# Patient Record
Sex: Female | Born: 1950 | Race: White | Hispanic: No | Marital: Married | Smoking: Never smoker
Health system: Southern US, Community
[De-identification: ages and names within clinical notes are randomized; demographics above are authoritative.]

## PROBLEM LIST (undated history)

## (undated) DIAGNOSIS — E785 Hyperlipidemia, unspecified: Secondary | ICD-10-CM

## (undated) DIAGNOSIS — F32A Depression, unspecified: Secondary | ICD-10-CM

## (undated) DIAGNOSIS — K760 Fatty (change of) liver, not elsewhere classified: Secondary | ICD-10-CM

## (undated) DIAGNOSIS — G4730 Sleep apnea, unspecified: Secondary | ICD-10-CM

## (undated) DIAGNOSIS — T8859XA Other complications of anesthesia, initial encounter: Secondary | ICD-10-CM

## (undated) DIAGNOSIS — G629 Polyneuropathy, unspecified: Secondary | ICD-10-CM

## (undated) DIAGNOSIS — R7303 Prediabetes: Secondary | ICD-10-CM

## (undated) DIAGNOSIS — I10 Essential (primary) hypertension: Secondary | ICD-10-CM

## (undated) DIAGNOSIS — G8929 Other chronic pain: Secondary | ICD-10-CM

## (undated) DIAGNOSIS — Z9889 Other specified postprocedural states: Secondary | ICD-10-CM

## (undated) DIAGNOSIS — R519 Headache, unspecified: Secondary | ICD-10-CM

## (undated) DIAGNOSIS — Z8709 Personal history of other diseases of the respiratory system: Secondary | ICD-10-CM

## (undated) DIAGNOSIS — J309 Allergic rhinitis, unspecified: Secondary | ICD-10-CM

## (undated) HISTORY — PX: BUNIONECTOMY: SHX129

## (undated) HISTORY — DX: Allergic rhinitis, unspecified: J30.9

## (undated) HISTORY — DX: Hyperlipidemia, unspecified: E78.5

## (undated) HISTORY — DX: Essential (primary) hypertension: I10

## (undated) HISTORY — PX: CATARACT EXTRACTION W/ INTRAOCULAR LENS IMPLANT: SHX1309

## (undated) HISTORY — PX: EYE SURGERY: SHX253

## (undated) HISTORY — DX: Sleep apnea, unspecified: G47.30

## (undated) HISTORY — DX: Headache, unspecified: R51.9

## (undated) HISTORY — DX: Other chronic pain: G89.29

---

## 2003-02-20 HISTORY — PX: LAPAROSCOPIC HYSTERECTOMY: SHX1926

## 2004-02-20 HISTORY — PX: CHOLECYSTECTOMY: SHX55

## 2007-05-26 ENCOUNTER — Encounter: Admission: RE | Admit: 2007-05-26 | Discharge: 2007-05-26 | Payer: Self-pay | Admitting: Family Medicine

## 2007-06-05 ENCOUNTER — Encounter: Admission: RE | Admit: 2007-06-05 | Discharge: 2007-06-05 | Payer: Self-pay | Admitting: Family Medicine

## 2008-04-05 ENCOUNTER — Encounter: Admission: RE | Admit: 2008-04-05 | Discharge: 2008-04-05 | Payer: Self-pay | Admitting: Family Medicine

## 2008-05-29 ENCOUNTER — Encounter: Admission: RE | Admit: 2008-05-29 | Discharge: 2008-05-29 | Payer: Self-pay | Admitting: Otolaryngology

## 2008-11-05 ENCOUNTER — Encounter: Admission: RE | Admit: 2008-11-05 | Discharge: 2008-11-05 | Payer: Self-pay | Admitting: Family Medicine

## 2009-06-16 ENCOUNTER — Emergency Department (HOSPITAL_COMMUNITY): Admission: EM | Admit: 2009-06-16 | Discharge: 2009-06-16 | Payer: Self-pay | Admitting: Emergency Medicine

## 2010-07-27 ENCOUNTER — Other Ambulatory Visit: Payer: Self-pay | Admitting: Family Medicine

## 2010-07-27 DIAGNOSIS — Z1231 Encounter for screening mammogram for malignant neoplasm of breast: Secondary | ICD-10-CM

## 2010-07-27 DIAGNOSIS — Z780 Asymptomatic menopausal state: Secondary | ICD-10-CM

## 2010-08-03 ENCOUNTER — Ambulatory Visit
Admission: RE | Admit: 2010-08-03 | Discharge: 2010-08-03 | Disposition: A | Discharge status: Home / Self Care | Payer: BC Managed Care – PPO | Source: Ambulatory Visit | Attending: Family Medicine | Admitting: Family Medicine

## 2010-08-03 DIAGNOSIS — Z780 Asymptomatic menopausal state: Secondary | ICD-10-CM

## 2010-08-03 DIAGNOSIS — Z1231 Encounter for screening mammogram for malignant neoplasm of breast: Secondary | ICD-10-CM

## 2011-07-31 ENCOUNTER — Other Ambulatory Visit: Payer: Self-pay | Admitting: Family Medicine

## 2011-07-31 DIAGNOSIS — Z1231 Encounter for screening mammogram for malignant neoplasm of breast: Secondary | ICD-10-CM

## 2011-08-03 ENCOUNTER — Ambulatory Visit
Admission: RE | Admit: 2011-08-03 | Discharge: 2011-08-03 | Disposition: A | Discharge status: Home / Self Care | Payer: BC Managed Care – PPO | Source: Ambulatory Visit | Attending: Family Medicine | Admitting: Family Medicine

## 2011-08-03 DIAGNOSIS — Z1231 Encounter for screening mammogram for malignant neoplasm of breast: Secondary | ICD-10-CM

## 2011-08-08 ENCOUNTER — Other Ambulatory Visit: Payer: Self-pay | Admitting: Family Medicine

## 2011-08-08 DIAGNOSIS — R928 Other abnormal and inconclusive findings on diagnostic imaging of breast: Secondary | ICD-10-CM

## 2011-08-09 ENCOUNTER — Other Ambulatory Visit: Payer: Self-pay | Admitting: Family Medicine

## 2011-08-16 ENCOUNTER — Ambulatory Visit
Admission: RE | Admit: 2011-08-16 | Discharge: 2011-08-16 | Disposition: A | Discharge status: Home / Self Care | Payer: BC Managed Care – PPO | Source: Ambulatory Visit | Attending: Family Medicine | Admitting: Family Medicine

## 2011-08-16 DIAGNOSIS — R928 Other abnormal and inconclusive findings on diagnostic imaging of breast: Secondary | ICD-10-CM

## 2011-12-10 ENCOUNTER — Ambulatory Visit (INDEPENDENT_AMBULATORY_CARE_PROVIDER_SITE_OTHER): Payer: BC Managed Care – PPO | Admitting: Licensed Clinical Social Worker

## 2011-12-10 DIAGNOSIS — IMO0002 Reserved for concepts with insufficient information to code with codable children: Secondary | ICD-10-CM

## 2012-01-21 ENCOUNTER — Ambulatory Visit (INDEPENDENT_AMBULATORY_CARE_PROVIDER_SITE_OTHER): Payer: BC Managed Care – PPO | Admitting: Licensed Clinical Social Worker

## 2012-01-21 DIAGNOSIS — IMO0002 Reserved for concepts with insufficient information to code with codable children: Secondary | ICD-10-CM

## 2012-02-18 ENCOUNTER — Ambulatory Visit (INDEPENDENT_AMBULATORY_CARE_PROVIDER_SITE_OTHER): Payer: Self-pay | Admitting: Licensed Clinical Social Worker

## 2012-02-18 DIAGNOSIS — IMO0002 Reserved for concepts with insufficient information to code with codable children: Secondary | ICD-10-CM

## 2012-03-17 ENCOUNTER — Ambulatory Visit (INDEPENDENT_AMBULATORY_CARE_PROVIDER_SITE_OTHER): Payer: BC Managed Care – PPO | Admitting: Licensed Clinical Social Worker

## 2012-03-17 DIAGNOSIS — IMO0002 Reserved for concepts with insufficient information to code with codable children: Secondary | ICD-10-CM

## 2012-04-14 ENCOUNTER — Ambulatory Visit (INDEPENDENT_AMBULATORY_CARE_PROVIDER_SITE_OTHER): Payer: BC Managed Care – PPO | Admitting: Licensed Clinical Social Worker

## 2012-04-14 DIAGNOSIS — IMO0002 Reserved for concepts with insufficient information to code with codable children: Secondary | ICD-10-CM

## 2012-05-12 ENCOUNTER — Ambulatory Visit (INDEPENDENT_AMBULATORY_CARE_PROVIDER_SITE_OTHER): Payer: BC Managed Care – PPO | Admitting: Licensed Clinical Social Worker

## 2012-05-12 DIAGNOSIS — IMO0002 Reserved for concepts with insufficient information to code with codable children: Secondary | ICD-10-CM

## 2012-06-11 ENCOUNTER — Ambulatory Visit (INDEPENDENT_AMBULATORY_CARE_PROVIDER_SITE_OTHER): Payer: BC Managed Care – PPO | Admitting: Licensed Clinical Social Worker

## 2012-06-11 DIAGNOSIS — IMO0002 Reserved for concepts with insufficient information to code with codable children: Secondary | ICD-10-CM

## 2012-07-09 ENCOUNTER — Ambulatory Visit (INDEPENDENT_AMBULATORY_CARE_PROVIDER_SITE_OTHER): Payer: BC Managed Care – PPO | Admitting: Licensed Clinical Social Worker

## 2012-07-09 DIAGNOSIS — IMO0002 Reserved for concepts with insufficient information to code with codable children: Secondary | ICD-10-CM

## 2012-08-04 ENCOUNTER — Ambulatory Visit (INDEPENDENT_AMBULATORY_CARE_PROVIDER_SITE_OTHER): Payer: BC Managed Care – PPO | Admitting: Licensed Clinical Social Worker

## 2012-08-04 DIAGNOSIS — IMO0002 Reserved for concepts with insufficient information to code with codable children: Secondary | ICD-10-CM

## 2012-09-01 ENCOUNTER — Ambulatory Visit (INDEPENDENT_AMBULATORY_CARE_PROVIDER_SITE_OTHER): Payer: BC Managed Care – PPO | Admitting: Licensed Clinical Social Worker

## 2012-09-01 DIAGNOSIS — IMO0002 Reserved for concepts with insufficient information to code with codable children: Secondary | ICD-10-CM

## 2012-09-29 ENCOUNTER — Ambulatory Visit (INDEPENDENT_AMBULATORY_CARE_PROVIDER_SITE_OTHER): Payer: BC Managed Care – PPO | Admitting: Licensed Clinical Social Worker

## 2012-09-29 DIAGNOSIS — IMO0002 Reserved for concepts with insufficient information to code with codable children: Secondary | ICD-10-CM

## 2012-10-14 ENCOUNTER — Other Ambulatory Visit: Payer: Self-pay | Admitting: Family Medicine

## 2012-10-14 DIAGNOSIS — Z1231 Encounter for screening mammogram for malignant neoplasm of breast: Secondary | ICD-10-CM

## 2012-10-27 ENCOUNTER — Ambulatory Visit (INDEPENDENT_AMBULATORY_CARE_PROVIDER_SITE_OTHER): Payer: BC Managed Care – PPO | Admitting: Licensed Clinical Social Worker

## 2012-10-27 DIAGNOSIS — IMO0002 Reserved for concepts with insufficient information to code with codable children: Secondary | ICD-10-CM

## 2012-11-04 ENCOUNTER — Ambulatory Visit
Admission: RE | Admit: 2012-11-04 | Discharge: 2012-11-04 | Disposition: A | Discharge status: Home / Self Care | Payer: BC Managed Care – PPO | Source: Ambulatory Visit | Attending: Family Medicine | Admitting: Family Medicine

## 2012-11-04 DIAGNOSIS — Z1231 Encounter for screening mammogram for malignant neoplasm of breast: Secondary | ICD-10-CM

## 2012-11-26 ENCOUNTER — Ambulatory Visit (INDEPENDENT_AMBULATORY_CARE_PROVIDER_SITE_OTHER): Payer: BC Managed Care – PPO | Admitting: Licensed Clinical Social Worker

## 2012-11-26 DIAGNOSIS — IMO0002 Reserved for concepts with insufficient information to code with codable children: Secondary | ICD-10-CM

## 2012-12-31 ENCOUNTER — Ambulatory Visit (INDEPENDENT_AMBULATORY_CARE_PROVIDER_SITE_OTHER): Payer: BC Managed Care – PPO | Admitting: Licensed Clinical Social Worker

## 2012-12-31 DIAGNOSIS — IMO0002 Reserved for concepts with insufficient information to code with codable children: Secondary | ICD-10-CM

## 2013-01-28 ENCOUNTER — Ambulatory Visit (INDEPENDENT_AMBULATORY_CARE_PROVIDER_SITE_OTHER): Payer: BC Managed Care – PPO | Admitting: Licensed Clinical Social Worker

## 2013-01-28 DIAGNOSIS — IMO0002 Reserved for concepts with insufficient information to code with codable children: Secondary | ICD-10-CM

## 2013-03-04 ENCOUNTER — Ambulatory Visit (INDEPENDENT_AMBULATORY_CARE_PROVIDER_SITE_OTHER): Payer: BC Managed Care – PPO | Admitting: Licensed Clinical Social Worker

## 2013-03-04 DIAGNOSIS — IMO0002 Reserved for concepts with insufficient information to code with codable children: Secondary | ICD-10-CM

## 2013-04-06 ENCOUNTER — Ambulatory Visit: Payer: BC Managed Care – PPO | Admitting: Licensed Clinical Social Worker

## 2013-05-04 ENCOUNTER — Ambulatory Visit (INDEPENDENT_AMBULATORY_CARE_PROVIDER_SITE_OTHER): Payer: BC Managed Care – PPO | Admitting: Licensed Clinical Social Worker

## 2013-05-04 DIAGNOSIS — IMO0002 Reserved for concepts with insufficient information to code with codable children: Secondary | ICD-10-CM

## 2013-06-22 ENCOUNTER — Ambulatory Visit (INDEPENDENT_AMBULATORY_CARE_PROVIDER_SITE_OTHER): Payer: BC Managed Care – PPO | Admitting: Licensed Clinical Social Worker

## 2013-06-22 DIAGNOSIS — IMO0002 Reserved for concepts with insufficient information to code with codable children: Secondary | ICD-10-CM

## 2013-07-27 ENCOUNTER — Ambulatory Visit (INDEPENDENT_AMBULATORY_CARE_PROVIDER_SITE_OTHER): Payer: BC Managed Care – PPO | Admitting: Licensed Clinical Social Worker

## 2013-07-27 DIAGNOSIS — IMO0002 Reserved for concepts with insufficient information to code with codable children: Secondary | ICD-10-CM

## 2013-08-31 ENCOUNTER — Ambulatory Visit (INDEPENDENT_AMBULATORY_CARE_PROVIDER_SITE_OTHER): Payer: BC Managed Care – PPO | Admitting: Licensed Clinical Social Worker

## 2013-08-31 DIAGNOSIS — IMO0002 Reserved for concepts with insufficient information to code with codable children: Secondary | ICD-10-CM

## 2013-09-28 ENCOUNTER — Ambulatory Visit (INDEPENDENT_AMBULATORY_CARE_PROVIDER_SITE_OTHER): Payer: BC Managed Care – PPO | Admitting: Licensed Clinical Social Worker

## 2013-09-28 DIAGNOSIS — IMO0002 Reserved for concepts with insufficient information to code with codable children: Secondary | ICD-10-CM

## 2013-11-09 ENCOUNTER — Ambulatory Visit (INDEPENDENT_AMBULATORY_CARE_PROVIDER_SITE_OTHER): Payer: BC Managed Care – PPO | Admitting: Licensed Clinical Social Worker

## 2013-11-09 DIAGNOSIS — IMO0002 Reserved for concepts with insufficient information to code with codable children: Secondary | ICD-10-CM

## 2013-12-14 ENCOUNTER — Ambulatory Visit (INDEPENDENT_AMBULATORY_CARE_PROVIDER_SITE_OTHER): Payer: BC Managed Care – PPO | Admitting: Licensed Clinical Social Worker

## 2013-12-14 DIAGNOSIS — F3341 Major depressive disorder, recurrent, in partial remission: Secondary | ICD-10-CM

## 2013-12-31 ENCOUNTER — Other Ambulatory Visit: Payer: Self-pay

## 2013-12-31 DIAGNOSIS — Z1231 Encounter for screening mammogram for malignant neoplasm of breast: Secondary | ICD-10-CM

## 2014-01-11 ENCOUNTER — Ambulatory Visit (INDEPENDENT_AMBULATORY_CARE_PROVIDER_SITE_OTHER): Payer: BC Managed Care – PPO | Admitting: Licensed Clinical Social Worker

## 2014-01-11 DIAGNOSIS — F3341 Major depressive disorder, recurrent, in partial remission: Secondary | ICD-10-CM

## 2014-01-12 ENCOUNTER — Ambulatory Visit
Admission: RE | Admit: 2014-01-12 | Discharge: 2014-01-12 | Disposition: A | Discharge status: Home / Self Care | Payer: BC Managed Care – PPO | Source: Ambulatory Visit

## 2014-01-12 DIAGNOSIS — Z1231 Encounter for screening mammogram for malignant neoplasm of breast: Secondary | ICD-10-CM

## 2014-02-08 ENCOUNTER — Ambulatory Visit (INDEPENDENT_AMBULATORY_CARE_PROVIDER_SITE_OTHER): Payer: BC Managed Care – PPO | Admitting: Licensed Clinical Social Worker

## 2014-02-08 ENCOUNTER — Other Ambulatory Visit: Payer: Self-pay | Admitting: Family Medicine

## 2014-02-08 DIAGNOSIS — M545 Low back pain: Secondary | ICD-10-CM

## 2014-02-08 DIAGNOSIS — F3341 Major depressive disorder, recurrent, in partial remission: Secondary | ICD-10-CM

## 2014-02-16 ENCOUNTER — Ambulatory Visit
Admission: RE | Admit: 2014-02-16 | Discharge: 2014-02-16 | Disposition: A | Discharge status: Home / Self Care | Payer: BC Managed Care – PPO | Source: Ambulatory Visit | Attending: Family Medicine | Admitting: Family Medicine

## 2014-02-16 DIAGNOSIS — M545 Low back pain: Secondary | ICD-10-CM

## 2014-03-22 ENCOUNTER — Ambulatory Visit (INDEPENDENT_AMBULATORY_CARE_PROVIDER_SITE_OTHER): Payer: BLUE CROSS/BLUE SHIELD | Admitting: Licensed Clinical Social Worker

## 2014-03-22 DIAGNOSIS — F3341 Major depressive disorder, recurrent, in partial remission: Secondary | ICD-10-CM

## 2014-04-19 ENCOUNTER — Ambulatory Visit (INDEPENDENT_AMBULATORY_CARE_PROVIDER_SITE_OTHER): Payer: BLUE CROSS/BLUE SHIELD | Admitting: Licensed Clinical Social Worker

## 2014-04-19 DIAGNOSIS — F3341 Major depressive disorder, recurrent, in partial remission: Secondary | ICD-10-CM

## 2014-05-17 ENCOUNTER — Ambulatory Visit (INDEPENDENT_AMBULATORY_CARE_PROVIDER_SITE_OTHER): Payer: BLUE CROSS/BLUE SHIELD | Admitting: Licensed Clinical Social Worker

## 2014-05-17 DIAGNOSIS — F3341 Major depressive disorder, recurrent, in partial remission: Secondary | ICD-10-CM

## 2014-06-14 ENCOUNTER — Ambulatory Visit (INDEPENDENT_AMBULATORY_CARE_PROVIDER_SITE_OTHER): Payer: BLUE CROSS/BLUE SHIELD | Admitting: Licensed Clinical Social Worker

## 2014-06-14 DIAGNOSIS — F3341 Major depressive disorder, recurrent, in partial remission: Secondary | ICD-10-CM | POA: Diagnosis not present

## 2014-07-12 ENCOUNTER — Ambulatory Visit: Payer: BLUE CROSS/BLUE SHIELD | Admitting: Licensed Clinical Social Worker

## 2014-08-09 ENCOUNTER — Ambulatory Visit (INDEPENDENT_AMBULATORY_CARE_PROVIDER_SITE_OTHER): Payer: BLUE CROSS/BLUE SHIELD | Admitting: Licensed Clinical Social Worker

## 2014-08-09 DIAGNOSIS — F3341 Major depressive disorder, recurrent, in partial remission: Secondary | ICD-10-CM | POA: Diagnosis not present

## 2014-09-06 ENCOUNTER — Ambulatory Visit (INDEPENDENT_AMBULATORY_CARE_PROVIDER_SITE_OTHER): Payer: BLUE CROSS/BLUE SHIELD | Admitting: Licensed Clinical Social Worker

## 2014-09-06 DIAGNOSIS — F3341 Major depressive disorder, recurrent, in partial remission: Secondary | ICD-10-CM | POA: Diagnosis not present

## 2014-10-04 ENCOUNTER — Ambulatory Visit (INDEPENDENT_AMBULATORY_CARE_PROVIDER_SITE_OTHER): Payer: BLUE CROSS/BLUE SHIELD | Admitting: Licensed Clinical Social Worker

## 2014-10-04 DIAGNOSIS — F3341 Major depressive disorder, recurrent, in partial remission: Secondary | ICD-10-CM | POA: Diagnosis not present

## 2014-11-01 ENCOUNTER — Ambulatory Visit (INDEPENDENT_AMBULATORY_CARE_PROVIDER_SITE_OTHER): Payer: BLUE CROSS/BLUE SHIELD | Admitting: Licensed Clinical Social Worker

## 2014-11-01 DIAGNOSIS — F3341 Major depressive disorder, recurrent, in partial remission: Secondary | ICD-10-CM

## 2014-11-29 ENCOUNTER — Ambulatory Visit (INDEPENDENT_AMBULATORY_CARE_PROVIDER_SITE_OTHER): Payer: BLUE CROSS/BLUE SHIELD | Admitting: Licensed Clinical Social Worker

## 2014-11-29 DIAGNOSIS — F3341 Major depressive disorder, recurrent, in partial remission: Secondary | ICD-10-CM

## 2014-12-09 ENCOUNTER — Other Ambulatory Visit: Payer: Self-pay

## 2014-12-09 DIAGNOSIS — Z1231 Encounter for screening mammogram for malignant neoplasm of breast: Secondary | ICD-10-CM

## 2014-12-27 ENCOUNTER — Ambulatory Visit (INDEPENDENT_AMBULATORY_CARE_PROVIDER_SITE_OTHER): Payer: BLUE CROSS/BLUE SHIELD | Admitting: Licensed Clinical Social Worker

## 2014-12-27 DIAGNOSIS — F3341 Major depressive disorder, recurrent, in partial remission: Secondary | ICD-10-CM | POA: Diagnosis not present

## 2015-01-05 ENCOUNTER — Ambulatory Visit
Admission: RE | Admit: 2015-01-05 | Discharge: 2015-01-05 | Disposition: A | Discharge status: Home / Self Care | Payer: Self-pay | Source: Ambulatory Visit | Attending: Family Medicine | Admitting: Family Medicine

## 2015-01-05 ENCOUNTER — Other Ambulatory Visit: Payer: Self-pay | Admitting: Family Medicine

## 2015-01-05 DIAGNOSIS — R109 Unspecified abdominal pain: Secondary | ICD-10-CM

## 2015-01-17 ENCOUNTER — Ambulatory Visit
Admission: RE | Admit: 2015-01-17 | Discharge: 2015-01-17 | Disposition: A | Discharge status: Home / Self Care | Payer: BLUE CROSS/BLUE SHIELD | Source: Ambulatory Visit

## 2015-01-17 DIAGNOSIS — Z1231 Encounter for screening mammogram for malignant neoplasm of breast: Secondary | ICD-10-CM

## 2015-01-24 ENCOUNTER — Ambulatory Visit (INDEPENDENT_AMBULATORY_CARE_PROVIDER_SITE_OTHER): Payer: BLUE CROSS/BLUE SHIELD | Admitting: Licensed Clinical Social Worker

## 2015-01-24 DIAGNOSIS — F3341 Major depressive disorder, recurrent, in partial remission: Secondary | ICD-10-CM | POA: Diagnosis not present

## 2015-02-21 ENCOUNTER — Ambulatory Visit (INDEPENDENT_AMBULATORY_CARE_PROVIDER_SITE_OTHER): Payer: BLUE CROSS/BLUE SHIELD | Admitting: Licensed Clinical Social Worker

## 2015-02-21 DIAGNOSIS — F3341 Major depressive disorder, recurrent, in partial remission: Secondary | ICD-10-CM

## 2015-03-21 ENCOUNTER — Ambulatory Visit (INDEPENDENT_AMBULATORY_CARE_PROVIDER_SITE_OTHER): Payer: BLUE CROSS/BLUE SHIELD | Admitting: Licensed Clinical Social Worker

## 2015-03-21 DIAGNOSIS — F3341 Major depressive disorder, recurrent, in partial remission: Secondary | ICD-10-CM

## 2015-04-18 ENCOUNTER — Ambulatory Visit (INDEPENDENT_AMBULATORY_CARE_PROVIDER_SITE_OTHER): Payer: BLUE CROSS/BLUE SHIELD | Admitting: Licensed Clinical Social Worker

## 2015-04-18 DIAGNOSIS — F3341 Major depressive disorder, recurrent, in partial remission: Secondary | ICD-10-CM

## 2015-05-16 ENCOUNTER — Ambulatory Visit (INDEPENDENT_AMBULATORY_CARE_PROVIDER_SITE_OTHER): Payer: BLUE CROSS/BLUE SHIELD | Admitting: Licensed Clinical Social Worker

## 2015-05-16 DIAGNOSIS — F3341 Major depressive disorder, recurrent, in partial remission: Secondary | ICD-10-CM

## 2015-06-13 ENCOUNTER — Ambulatory Visit (INDEPENDENT_AMBULATORY_CARE_PROVIDER_SITE_OTHER): Payer: BLUE CROSS/BLUE SHIELD | Admitting: Licensed Clinical Social Worker

## 2015-06-13 DIAGNOSIS — F3341 Major depressive disorder, recurrent, in partial remission: Secondary | ICD-10-CM

## 2015-07-25 ENCOUNTER — Ambulatory Visit (INDEPENDENT_AMBULATORY_CARE_PROVIDER_SITE_OTHER): Payer: BLUE CROSS/BLUE SHIELD | Admitting: Licensed Clinical Social Worker

## 2015-07-25 DIAGNOSIS — F3341 Major depressive disorder, recurrent, in partial remission: Secondary | ICD-10-CM

## 2015-08-29 ENCOUNTER — Ambulatory Visit: Payer: Self-pay | Admitting: Licensed Clinical Social Worker

## 2015-09-02 ENCOUNTER — Ambulatory Visit (INDEPENDENT_AMBULATORY_CARE_PROVIDER_SITE_OTHER): Payer: BLUE CROSS/BLUE SHIELD | Admitting: Licensed Clinical Social Worker

## 2015-09-02 DIAGNOSIS — F3341 Major depressive disorder, recurrent, in partial remission: Secondary | ICD-10-CM | POA: Diagnosis not present

## 2015-09-27 ENCOUNTER — Ambulatory Visit (INDEPENDENT_AMBULATORY_CARE_PROVIDER_SITE_OTHER): Payer: BLUE CROSS/BLUE SHIELD | Admitting: Licensed Clinical Social Worker

## 2015-09-27 DIAGNOSIS — F3341 Major depressive disorder, recurrent, in partial remission: Secondary | ICD-10-CM | POA: Diagnosis not present

## 2015-10-31 ENCOUNTER — Ambulatory Visit (INDEPENDENT_AMBULATORY_CARE_PROVIDER_SITE_OTHER): Payer: BLUE CROSS/BLUE SHIELD | Admitting: Licensed Clinical Social Worker

## 2015-10-31 DIAGNOSIS — F3341 Major depressive disorder, recurrent, in partial remission: Secondary | ICD-10-CM

## 2015-11-28 ENCOUNTER — Ambulatory Visit (INDEPENDENT_AMBULATORY_CARE_PROVIDER_SITE_OTHER): Payer: BLUE CROSS/BLUE SHIELD | Admitting: Licensed Clinical Social Worker

## 2015-11-28 DIAGNOSIS — F3341 Major depressive disorder, recurrent, in partial remission: Secondary | ICD-10-CM | POA: Diagnosis not present

## 2015-12-26 ENCOUNTER — Ambulatory Visit (INDEPENDENT_AMBULATORY_CARE_PROVIDER_SITE_OTHER): Payer: BLUE CROSS/BLUE SHIELD | Admitting: Licensed Clinical Social Worker

## 2015-12-26 DIAGNOSIS — F3341 Major depressive disorder, recurrent, in partial remission: Secondary | ICD-10-CM

## 2016-01-23 ENCOUNTER — Ambulatory Visit (INDEPENDENT_AMBULATORY_CARE_PROVIDER_SITE_OTHER): Payer: BLUE CROSS/BLUE SHIELD | Admitting: Licensed Clinical Social Worker

## 2016-01-23 DIAGNOSIS — F3341 Major depressive disorder, recurrent, in partial remission: Secondary | ICD-10-CM

## 2016-02-01 ENCOUNTER — Other Ambulatory Visit: Payer: Self-pay | Admitting: Family Medicine

## 2016-02-01 DIAGNOSIS — Z1231 Encounter for screening mammogram for malignant neoplasm of breast: Secondary | ICD-10-CM

## 2016-02-21 ENCOUNTER — Ambulatory Visit (INDEPENDENT_AMBULATORY_CARE_PROVIDER_SITE_OTHER): Payer: BLUE CROSS/BLUE SHIELD | Admitting: Licensed Clinical Social Worker

## 2016-02-21 DIAGNOSIS — F3341 Major depressive disorder, recurrent, in partial remission: Secondary | ICD-10-CM | POA: Diagnosis not present

## 2016-03-01 ENCOUNTER — Ambulatory Visit
Admission: RE | Admit: 2016-03-01 | Discharge: 2016-03-01 | Disposition: A | Discharge status: Home / Self Care | Payer: BLUE CROSS/BLUE SHIELD | Source: Ambulatory Visit | Attending: Family Medicine | Admitting: Family Medicine

## 2016-03-01 DIAGNOSIS — Z1231 Encounter for screening mammogram for malignant neoplasm of breast: Secondary | ICD-10-CM

## 2016-03-20 ENCOUNTER — Ambulatory Visit (INDEPENDENT_AMBULATORY_CARE_PROVIDER_SITE_OTHER): Payer: BLUE CROSS/BLUE SHIELD | Admitting: Licensed Clinical Social Worker

## 2016-03-20 DIAGNOSIS — F3341 Major depressive disorder, recurrent, in partial remission: Secondary | ICD-10-CM

## 2016-04-17 ENCOUNTER — Ambulatory Visit (INDEPENDENT_AMBULATORY_CARE_PROVIDER_SITE_OTHER): Payer: BLUE CROSS/BLUE SHIELD | Admitting: Licensed Clinical Social Worker

## 2016-04-17 DIAGNOSIS — F3341 Major depressive disorder, recurrent, in partial remission: Secondary | ICD-10-CM

## 2016-04-19 ENCOUNTER — Other Ambulatory Visit (HOSPITAL_BASED_OUTPATIENT_CLINIC_OR_DEPARTMENT_OTHER): Payer: Self-pay

## 2016-04-19 DIAGNOSIS — G4730 Sleep apnea, unspecified: Secondary | ICD-10-CM

## 2016-04-19 DIAGNOSIS — R0683 Snoring: Secondary | ICD-10-CM

## 2016-04-19 DIAGNOSIS — G4710 Hypersomnia, unspecified: Secondary | ICD-10-CM

## 2016-04-19 DIAGNOSIS — R5383 Other fatigue: Secondary | ICD-10-CM

## 2016-05-15 ENCOUNTER — Encounter (HOSPITAL_BASED_OUTPATIENT_CLINIC_OR_DEPARTMENT_OTHER): Payer: Self-pay

## 2016-05-15 ENCOUNTER — Ambulatory Visit (INDEPENDENT_AMBULATORY_CARE_PROVIDER_SITE_OTHER): Payer: BLUE CROSS/BLUE SHIELD | Admitting: Licensed Clinical Social Worker

## 2016-05-15 DIAGNOSIS — F3341 Major depressive disorder, recurrent, in partial remission: Secondary | ICD-10-CM | POA: Diagnosis not present

## 2016-05-21 ENCOUNTER — Other Ambulatory Visit (HOSPITAL_BASED_OUTPATIENT_CLINIC_OR_DEPARTMENT_OTHER): Payer: Self-pay

## 2016-05-21 DIAGNOSIS — G4730 Sleep apnea, unspecified: Secondary | ICD-10-CM

## 2016-05-28 ENCOUNTER — Ambulatory Visit (HOSPITAL_BASED_OUTPATIENT_CLINIC_OR_DEPARTMENT_OTHER): Payer: BLUE CROSS/BLUE SHIELD | Attending: Family Medicine | Admitting: Internal Medicine

## 2016-05-28 DIAGNOSIS — G4733 Obstructive sleep apnea (adult) (pediatric): Secondary | ICD-10-CM

## 2016-05-31 ENCOUNTER — Other Ambulatory Visit (HOSPITAL_BASED_OUTPATIENT_CLINIC_OR_DEPARTMENT_OTHER): Payer: Self-pay

## 2016-05-31 DIAGNOSIS — G4730 Sleep apnea, unspecified: Secondary | ICD-10-CM

## 2016-06-09 DIAGNOSIS — G4733 Obstructive sleep apnea (adult) (pediatric): Secondary | ICD-10-CM

## 2016-06-09 NOTE — Procedures (Signed)
    Patient Name: Kimberly Murray, Kimberly Murray Date: 05/28/2016 Gender: Female D.O.B: Jul 03, 1950 Age (years): 42 Referring Provider: Lewis Moccasin Height (inches): 64 Interpreting Physician: Jetty Duhamel MD, ABSM Weight (lbs): 230 RPSGT: Warrior Sink BMI: 39 MRN: [id number] Neck Size: 15.00 CLINICAL INFORMATION Sleep Study Type: unattended HST     Indication for sleep study: non-restorative sleep     Epworth Sleepiness Score: 13  SLEEP STUDY TECHNIQUE A multi-channel overnight portable sleep study was performed. The channels recorded were: nasal airflow, thoracic respiratory movement, and oxygen saturation with a pulse oximetry. Snoring was also monitored.  MEDICATIONS Patient self administered medications include: none reported.  SLEEP ARCHITECTURE Patient was studied for 401.5 minutes. The sleep efficiency was 98.8 % and the patient was supine for 71.4%. The arousal index was 0.0 per hour.  RESPIRATORY PARAMETERS The overall AHI was 16.7 per hour, with a central apnea index of 0.1 per hour.  The oxygen nadir was 78% during sleep.  CARDIAC DATA Mean heart rate during sleep was 68.7 bpm.  IMPRESSIONS - Moderate obstructive sleep apnea occurred during this study (AHI = 16.7/h). - No significant central sleep apnea occurred during this study (CAI = 0.1/h). - Severe oxygen desaturation was noted during this study (Min O2 = 78%). - Patient snored .  DIAGNOSIS - Obstructive Sleep Apnea (327.23 [G47.33 ICD-10]) - Nocturnal Hypoxemia (327.26 [G47.36 ICD-10])  RECOMMENDATIONS - CPAP titration is recommended. Other interventions based on clinical judgment. - Positional therapy avoiding supine position during sleep. - Be careful with alcohol, sedatives and other CNS depressants that may worsen sleep apnea and disrupt normal sleep architecture. - Sleep hygiene should be reviewed to assess factors that may improve sleep quality. - Weight management and regular  exercise should be initiated or continued.  [Electronically signed] 06/09/2016 12:50 PM  Jetty Duhamel MD, ABSM Diplomate, American Board of Sleep Medicine   NPI: [id number]  Waymon Budge Diplomate, American Board of Sleep Medicine  ELECTRONICALLY SIGNED ON:  06/09/2016, 12:51 PM [facility] PH: [phone]   FX: [phone] ACCREDITED BY THE AMERICAN ACADEMY OF SLEEP MEDICINE

## 2016-06-12 ENCOUNTER — Ambulatory Visit (INDEPENDENT_AMBULATORY_CARE_PROVIDER_SITE_OTHER): Payer: BLUE CROSS/BLUE SHIELD | Admitting: Licensed Clinical Social Worker

## 2016-06-12 DIAGNOSIS — F3341 Major depressive disorder, recurrent, in partial remission: Secondary | ICD-10-CM | POA: Diagnosis not present

## 2016-07-01 ENCOUNTER — Ambulatory Visit (HOSPITAL_BASED_OUTPATIENT_CLINIC_OR_DEPARTMENT_OTHER): Payer: BLUE CROSS/BLUE SHIELD | Attending: Family Medicine | Admitting: Internal Medicine

## 2016-07-01 VITALS — Ht 66.0 in | Wt 230.0 lb

## 2016-07-01 DIAGNOSIS — G4730 Sleep apnea, unspecified: Secondary | ICD-10-CM | POA: Diagnosis present

## 2016-07-01 DIAGNOSIS — G4733 Obstructive sleep apnea (adult) (pediatric): Secondary | ICD-10-CM | POA: Insufficient documentation

## 2016-07-01 DIAGNOSIS — G4761 Periodic limb movement disorder: Secondary | ICD-10-CM | POA: Diagnosis not present

## 2016-07-07 DIAGNOSIS — G4733 Obstructive sleep apnea (adult) (pediatric): Secondary | ICD-10-CM | POA: Diagnosis not present

## 2016-07-07 NOTE — Procedures (Signed)
  Patient Name: Kimberly Murray, Kimberly Murray Study Date: 07/01/2016 Gender: Female D.O.B: December 27, 1950 Age (years): [age] Referring Provider: Lewis MoccasinElizabeth R Dewey Height (inches): 64 Interpreting Physician: Jetty Duhamellinton Judene Logue MD, ABSM Weight (lbs): 230 RPSGT: Rolene ArbourMcConnico, Yvonne BMI: 39 MRN: [id number] Neck Size: 15.00 CLINICAL INFORMATION The patient is referred for a CPAP titration to treat sleep apnea  Date of NPSG, Split Night or HST:  Unattended HST 05/28/16  AHI 16.7/ hr, desaturation to 78%, body weight 230 lbs  SLEEP STUDY TECHNIQUE As per the AASM Manual for the Scoring of Sleep and Associated Events v2.3 (April 2016) with a hypopnea requiring 4% desaturations.  The channels recorded and monitored were frontal, central and occipital EEG, electrooculogram (EOG), submentalis EMG (chin), nasal and oral airflow, thoracic and abdominal wall motion, anterior tibialis EMG, snore microphone, electrocardiogram, and pulse oximetry. Continuous positive airway pressure (CPAP) was initiated at the beginning of the study and titrated to treat sleep-disordered breathing.  MEDICATIONS Medications self-administered by patient taken the night of the study : none reported  TECHNICIAN COMMENTS Comments added by technician: NONE  Comments added by scorer: N/A  RESPIRATORY PARAMETERS Optimal PAP Pressure (cm): 10 AHI at Optimal Pressure (/hr): 0.0 Overall Minimal O2 (%): 0.00 Supine % at Optimal Pressure (%): 44 Minimal O2 at Optimal Pressure (%): 89.0    SLEEP ARCHITECTURE The study was initiated at 10:31:14 PM and ended at 4:46:42 AM.  Sleep onset time was 18.8 minutes and the sleep efficiency was 73.3%. The total sleep time was 275.2 minutes.  The patient spent 2.73% of the night in stage N1 sleep, 86.01% in stage N2 sleep, 0.00% in stage N3 and 11.26% in REM.Stage REM latency was 126.5 minutes  Wake after sleep onset was 81.5. Alpha intrusion was absent. Supine sleep was 45.42%.  CARDIAC DATA The 2 lead EKG  demonstrated sinus rhythm. The mean heart rate was 67.77 beats per minute. Other EKG findings include: None.  LEG MOVEMENT DATA The total Periodic Limb Movements of Sleep (PLMS) were 279. The PLMS index was 60.83. A PLMS index of <15 is considered normal in adults.  IMPRESSIONS - The optimal PAP pressure was 10 cm of water. - Central sleep apnea was not noted during this titration (CAI = 0.0/h). - Mild oxygen desaturations were observed during this titration (min O2 = 87%). - No snoring was audible during this study. - No cardiac abnormalities were observed during this study. - Periodic limb movements were observed during this study. 279 total limb movements, 21 limb movements with arousal- Arousal Index 4.6/ hr . DIAGNOSIS - Obstructive Sleep Apnea (327.23 [G47.33 ICD-10]) - Periodic Limb Movement Syndrome (327.51 [G47.61 ICD-10])  RECOMMENDATIONS - Trial of CPAP therapy on 10 cm H2O with a Medium size Resmed Nasal Pillow Mask AirFit P10 mask and heated humidification. - Avoid alcohol, sedatives and other CNS depressants that may worsen sleep apnea and disrupt normal sleep architecture. - Sleep hygiene should be reviewed to assess factors that may improve sleep quality. - Weight management and regular exercise should be initiated or continued.  [Electronically signed] 07/07/2016 11:46 AM  Jetty Duhamellinton Analisia Kingsford MD, ABSM Diplomate, American Board of Sleep Medicine   NPI: [id number]  Waymon BudgeYOUNG,Vernette Moise D Diplomate, American Board of Sleep Medicine  ELECTRONICALLY SIGNED ON:  07/07/2016, 11:39 AM [facility] PH: [phone]   FX: [phone] ACCREDITED BY THE AMERICAN ACADEMY OF SLEEP MEDICINE

## 2016-07-24 ENCOUNTER — Ambulatory Visit (INDEPENDENT_AMBULATORY_CARE_PROVIDER_SITE_OTHER): Payer: BLUE CROSS/BLUE SHIELD | Admitting: Licensed Clinical Social Worker

## 2016-07-24 DIAGNOSIS — F3341 Major depressive disorder, recurrent, in partial remission: Secondary | ICD-10-CM | POA: Diagnosis not present

## 2016-09-04 ENCOUNTER — Ambulatory Visit (INDEPENDENT_AMBULATORY_CARE_PROVIDER_SITE_OTHER): Payer: BLUE CROSS/BLUE SHIELD | Admitting: Licensed Clinical Social Worker

## 2016-09-04 DIAGNOSIS — F3341 Major depressive disorder, recurrent, in partial remission: Secondary | ICD-10-CM

## 2016-10-16 ENCOUNTER — Ambulatory Visit (INDEPENDENT_AMBULATORY_CARE_PROVIDER_SITE_OTHER): Payer: BLUE CROSS/BLUE SHIELD | Admitting: Licensed Clinical Social Worker

## 2016-10-16 DIAGNOSIS — F3341 Major depressive disorder, recurrent, in partial remission: Secondary | ICD-10-CM

## 2016-11-27 ENCOUNTER — Ambulatory Visit (INDEPENDENT_AMBULATORY_CARE_PROVIDER_SITE_OTHER): Payer: BLUE CROSS/BLUE SHIELD | Admitting: Licensed Clinical Social Worker

## 2016-11-27 DIAGNOSIS — F3341 Major depressive disorder, recurrent, in partial remission: Secondary | ICD-10-CM

## 2017-01-01 DIAGNOSIS — K219 Gastro-esophageal reflux disease without esophagitis: Secondary | ICD-10-CM | POA: Diagnosis not present

## 2017-01-01 DIAGNOSIS — E782 Mixed hyperlipidemia: Secondary | ICD-10-CM | POA: Diagnosis not present

## 2017-01-01 DIAGNOSIS — Z6841 Body Mass Index (BMI) 40.0 and over, adult: Secondary | ICD-10-CM | POA: Diagnosis not present

## 2017-01-02 DIAGNOSIS — R482 Apraxia: Secondary | ICD-10-CM | POA: Diagnosis not present

## 2017-01-02 DIAGNOSIS — G245 Blepharospasm: Secondary | ICD-10-CM | POA: Diagnosis not present

## 2017-01-02 DIAGNOSIS — H02831 Dermatochalasis of right upper eyelid: Secondary | ICD-10-CM | POA: Diagnosis not present

## 2017-01-02 DIAGNOSIS — H5340 Unspecified visual field defects: Secondary | ICD-10-CM | POA: Diagnosis not present

## 2017-01-02 DIAGNOSIS — H02834 Dermatochalasis of left upper eyelid: Secondary | ICD-10-CM | POA: Diagnosis not present

## 2017-01-08 ENCOUNTER — Ambulatory Visit (INDEPENDENT_AMBULATORY_CARE_PROVIDER_SITE_OTHER): Payer: Medicare Other | Admitting: Licensed Clinical Social Worker

## 2017-01-08 DIAGNOSIS — F3341 Major depressive disorder, recurrent, in partial remission: Secondary | ICD-10-CM

## 2017-01-17 DIAGNOSIS — Z86010 Personal history of colonic polyps: Secondary | ICD-10-CM | POA: Diagnosis not present

## 2017-01-17 DIAGNOSIS — K5730 Diverticulosis of large intestine without perforation or abscess without bleeding: Secondary | ICD-10-CM | POA: Diagnosis not present

## 2017-01-17 DIAGNOSIS — Z1211 Encounter for screening for malignant neoplasm of colon: Secondary | ICD-10-CM | POA: Diagnosis not present

## 2017-02-25 DIAGNOSIS — E782 Mixed hyperlipidemia: Secondary | ICD-10-CM | POA: Diagnosis not present

## 2017-02-25 DIAGNOSIS — I10 Essential (primary) hypertension: Secondary | ICD-10-CM | POA: Diagnosis not present

## 2017-02-25 DIAGNOSIS — G43009 Migraine without aura, not intractable, without status migrainosus: Secondary | ICD-10-CM | POA: Diagnosis not present

## 2017-02-25 DIAGNOSIS — H02403 Unspecified ptosis of bilateral eyelids: Secondary | ICD-10-CM | POA: Diagnosis not present

## 2017-02-26 ENCOUNTER — Ambulatory Visit (INDEPENDENT_AMBULATORY_CARE_PROVIDER_SITE_OTHER): Payer: Medicare Other | Admitting: Licensed Clinical Social Worker

## 2017-02-26 DIAGNOSIS — F3341 Major depressive disorder, recurrent, in partial remission: Secondary | ICD-10-CM | POA: Diagnosis not present

## 2017-03-07 DIAGNOSIS — H02422 Myogenic ptosis of left eyelid: Secondary | ICD-10-CM | POA: Diagnosis not present

## 2017-03-07 DIAGNOSIS — H02421 Myogenic ptosis of right eyelid: Secondary | ICD-10-CM | POA: Diagnosis not present

## 2017-03-07 DIAGNOSIS — H02413 Mechanical ptosis of bilateral eyelids: Secondary | ICD-10-CM | POA: Diagnosis not present

## 2017-03-07 DIAGNOSIS — H0279 Other degenerative disorders of eyelid and periocular area: Secondary | ICD-10-CM | POA: Diagnosis not present

## 2017-03-07 DIAGNOSIS — H02433 Paralytic ptosis of bilateral eyelids: Secondary | ICD-10-CM | POA: Diagnosis not present

## 2017-03-07 DIAGNOSIS — G244 Idiopathic orofacial dystonia: Secondary | ICD-10-CM | POA: Diagnosis not present

## 2017-03-07 DIAGNOSIS — G245 Blepharospasm: Secondary | ICD-10-CM | POA: Diagnosis not present

## 2017-03-26 DIAGNOSIS — G245 Blepharospasm: Secondary | ICD-10-CM | POA: Diagnosis not present

## 2017-03-26 DIAGNOSIS — H02421 Myogenic ptosis of right eyelid: Secondary | ICD-10-CM | POA: Diagnosis not present

## 2017-03-26 DIAGNOSIS — H02433 Paralytic ptosis of bilateral eyelids: Secondary | ICD-10-CM | POA: Diagnosis not present

## 2017-03-26 DIAGNOSIS — H0279 Other degenerative disorders of eyelid and periocular area: Secondary | ICD-10-CM | POA: Diagnosis not present

## 2017-03-26 DIAGNOSIS — H02422 Myogenic ptosis of left eyelid: Secondary | ICD-10-CM | POA: Diagnosis not present

## 2017-03-26 DIAGNOSIS — G244 Idiopathic orofacial dystonia: Secondary | ICD-10-CM | POA: Diagnosis not present

## 2017-03-26 DIAGNOSIS — H02413 Mechanical ptosis of bilateral eyelids: Secondary | ICD-10-CM | POA: Diagnosis not present

## 2017-04-08 ENCOUNTER — Ambulatory Visit (INDEPENDENT_AMBULATORY_CARE_PROVIDER_SITE_OTHER): Payer: Medicare Other | Admitting: Licensed Clinical Social Worker

## 2017-04-08 DIAGNOSIS — F3341 Major depressive disorder, recurrent, in partial remission: Secondary | ICD-10-CM

## 2017-05-01 DIAGNOSIS — E782 Mixed hyperlipidemia: Secondary | ICD-10-CM | POA: Diagnosis not present

## 2017-05-01 DIAGNOSIS — M7071 Other bursitis of hip, right hip: Secondary | ICD-10-CM | POA: Diagnosis not present

## 2017-05-01 DIAGNOSIS — I10 Essential (primary) hypertension: Secondary | ICD-10-CM | POA: Diagnosis not present

## 2017-05-01 DIAGNOSIS — F330 Major depressive disorder, recurrent, mild: Secondary | ICD-10-CM | POA: Diagnosis not present

## 2017-05-01 DIAGNOSIS — K219 Gastro-esophageal reflux disease without esophagitis: Secondary | ICD-10-CM | POA: Diagnosis not present

## 2017-05-21 ENCOUNTER — Ambulatory Visit (INDEPENDENT_AMBULATORY_CARE_PROVIDER_SITE_OTHER): Payer: Medicare Other | Admitting: Licensed Clinical Social Worker

## 2017-05-21 DIAGNOSIS — F3341 Major depressive disorder, recurrent, in partial remission: Secondary | ICD-10-CM

## 2017-06-25 ENCOUNTER — Ambulatory Visit (INDEPENDENT_AMBULATORY_CARE_PROVIDER_SITE_OTHER): Payer: Medicare Other | Admitting: Licensed Clinical Social Worker

## 2017-06-25 DIAGNOSIS — F3341 Major depressive disorder, recurrent, in partial remission: Secondary | ICD-10-CM | POA: Diagnosis not present

## 2017-07-01 DIAGNOSIS — H02433 Paralytic ptosis of bilateral eyelids: Secondary | ICD-10-CM | POA: Diagnosis not present

## 2017-07-01 DIAGNOSIS — G245 Blepharospasm: Secondary | ICD-10-CM | POA: Diagnosis not present

## 2017-07-01 DIAGNOSIS — G244 Idiopathic orofacial dystonia: Secondary | ICD-10-CM | POA: Diagnosis not present

## 2017-07-01 DIAGNOSIS — H02421 Myogenic ptosis of right eyelid: Secondary | ICD-10-CM | POA: Diagnosis not present

## 2017-07-01 DIAGNOSIS — H53483 Generalized contraction of visual field, bilateral: Secondary | ICD-10-CM | POA: Diagnosis not present

## 2017-07-01 DIAGNOSIS — H02422 Myogenic ptosis of left eyelid: Secondary | ICD-10-CM | POA: Diagnosis not present

## 2017-07-01 DIAGNOSIS — H02413 Mechanical ptosis of bilateral eyelids: Secondary | ICD-10-CM | POA: Diagnosis not present

## 2017-07-01 DIAGNOSIS — H0279 Other degenerative disorders of eyelid and periocular area: Secondary | ICD-10-CM | POA: Diagnosis not present

## 2017-07-03 DIAGNOSIS — M7071 Other bursitis of hip, right hip: Secondary | ICD-10-CM | POA: Diagnosis not present

## 2017-07-03 DIAGNOSIS — F330 Major depressive disorder, recurrent, mild: Secondary | ICD-10-CM | POA: Diagnosis not present

## 2017-07-03 DIAGNOSIS — I10 Essential (primary) hypertension: Secondary | ICD-10-CM | POA: Diagnosis not present

## 2017-07-24 ENCOUNTER — Ambulatory Visit (INDEPENDENT_AMBULATORY_CARE_PROVIDER_SITE_OTHER): Payer: Medicare Other | Admitting: Licensed Clinical Social Worker

## 2017-07-24 DIAGNOSIS — F3341 Major depressive disorder, recurrent, in partial remission: Secondary | ICD-10-CM

## 2017-07-29 DIAGNOSIS — H53453 Other localized visual field defect, bilateral: Secondary | ICD-10-CM | POA: Diagnosis not present

## 2017-07-29 DIAGNOSIS — H02423 Myogenic ptosis of bilateral eyelids: Secondary | ICD-10-CM | POA: Diagnosis not present

## 2017-07-29 DIAGNOSIS — H02834 Dermatochalasis of left upper eyelid: Secondary | ICD-10-CM | POA: Diagnosis not present

## 2017-07-29 DIAGNOSIS — R482 Apraxia: Secondary | ICD-10-CM | POA: Diagnosis not present

## 2017-07-29 DIAGNOSIS — H02421 Myogenic ptosis of right eyelid: Secondary | ICD-10-CM | POA: Diagnosis not present

## 2017-07-29 DIAGNOSIS — G244 Idiopathic orofacial dystonia: Secondary | ICD-10-CM | POA: Diagnosis not present

## 2017-07-29 DIAGNOSIS — H02433 Paralytic ptosis of bilateral eyelids: Secondary | ICD-10-CM | POA: Diagnosis not present

## 2017-07-29 DIAGNOSIS — H53483 Generalized contraction of visual field, bilateral: Secondary | ICD-10-CM | POA: Diagnosis not present

## 2017-07-29 DIAGNOSIS — G245 Blepharospasm: Secondary | ICD-10-CM | POA: Diagnosis not present

## 2017-07-29 DIAGNOSIS — H0279 Other degenerative disorders of eyelid and periocular area: Secondary | ICD-10-CM | POA: Diagnosis not present

## 2017-07-29 DIAGNOSIS — H02413 Mechanical ptosis of bilateral eyelids: Secondary | ICD-10-CM | POA: Diagnosis not present

## 2017-07-29 DIAGNOSIS — H02422 Myogenic ptosis of left eyelid: Secondary | ICD-10-CM | POA: Diagnosis not present

## 2017-07-29 DIAGNOSIS — H02831 Dermatochalasis of right upper eyelid: Secondary | ICD-10-CM | POA: Diagnosis not present

## 2017-08-19 ENCOUNTER — Ambulatory Visit (INDEPENDENT_AMBULATORY_CARE_PROVIDER_SITE_OTHER): Payer: Medicare Other | Admitting: Licensed Clinical Social Worker

## 2017-08-19 DIAGNOSIS — M150 Primary generalized (osteo)arthritis: Secondary | ICD-10-CM | POA: Diagnosis not present

## 2017-08-19 DIAGNOSIS — I10 Essential (primary) hypertension: Secondary | ICD-10-CM | POA: Diagnosis not present

## 2017-08-19 DIAGNOSIS — Z6841 Body Mass Index (BMI) 40.0 and over, adult: Secondary | ICD-10-CM | POA: Diagnosis not present

## 2017-08-19 DIAGNOSIS — M7542 Impingement syndrome of left shoulder: Secondary | ICD-10-CM | POA: Diagnosis not present

## 2017-08-19 DIAGNOSIS — M19012 Primary osteoarthritis, left shoulder: Secondary | ICD-10-CM | POA: Diagnosis not present

## 2017-08-19 DIAGNOSIS — F3341 Major depressive disorder, recurrent, in partial remission: Secondary | ICD-10-CM | POA: Diagnosis not present

## 2017-09-03 DIAGNOSIS — H2513 Age-related nuclear cataract, bilateral: Secondary | ICD-10-CM | POA: Diagnosis not present

## 2017-09-16 DIAGNOSIS — M19012 Primary osteoarthritis, left shoulder: Secondary | ICD-10-CM | POA: Diagnosis not present

## 2017-09-16 DIAGNOSIS — M7071 Other bursitis of hip, right hip: Secondary | ICD-10-CM | POA: Diagnosis not present

## 2017-09-16 DIAGNOSIS — M76892 Other specified enthesopathies of left lower limb, excluding foot: Secondary | ICD-10-CM | POA: Diagnosis not present

## 2017-09-16 DIAGNOSIS — M7542 Impingement syndrome of left shoulder: Secondary | ICD-10-CM | POA: Diagnosis not present

## 2017-09-16 DIAGNOSIS — Z6841 Body Mass Index (BMI) 40.0 and over, adult: Secondary | ICD-10-CM | POA: Diagnosis not present

## 2017-09-19 ENCOUNTER — Ambulatory Visit: Payer: Medicare Other | Admitting: Licensed Clinical Social Worker

## 2017-09-19 ENCOUNTER — Ambulatory Visit (INDEPENDENT_AMBULATORY_CARE_PROVIDER_SITE_OTHER): Payer: Medicare Other | Admitting: Licensed Clinical Social Worker

## 2017-09-19 DIAGNOSIS — F3341 Major depressive disorder, recurrent, in partial remission: Secondary | ICD-10-CM | POA: Diagnosis not present

## 2017-10-29 DIAGNOSIS — E782 Mixed hyperlipidemia: Secondary | ICD-10-CM | POA: Diagnosis not present

## 2017-10-29 DIAGNOSIS — E039 Hypothyroidism, unspecified: Secondary | ICD-10-CM | POA: Diagnosis not present

## 2017-10-29 DIAGNOSIS — Z79899 Other long term (current) drug therapy: Secondary | ICD-10-CM | POA: Diagnosis not present

## 2017-10-29 DIAGNOSIS — I10 Essential (primary) hypertension: Secondary | ICD-10-CM | POA: Diagnosis not present

## 2017-10-30 ENCOUNTER — Ambulatory Visit (INDEPENDENT_AMBULATORY_CARE_PROVIDER_SITE_OTHER): Payer: Medicare Other | Admitting: Licensed Clinical Social Worker

## 2017-10-30 DIAGNOSIS — F3341 Major depressive disorder, recurrent, in partial remission: Secondary | ICD-10-CM

## 2017-10-31 DIAGNOSIS — Z23 Encounter for immunization: Secondary | ICD-10-CM | POA: Diagnosis not present

## 2017-10-31 DIAGNOSIS — H6122 Impacted cerumen, left ear: Secondary | ICD-10-CM | POA: Diagnosis not present

## 2017-10-31 DIAGNOSIS — I10 Essential (primary) hypertension: Secondary | ICD-10-CM | POA: Diagnosis not present

## 2017-10-31 DIAGNOSIS — G4733 Obstructive sleep apnea (adult) (pediatric): Secondary | ICD-10-CM | POA: Diagnosis not present

## 2017-10-31 DIAGNOSIS — Z0000 Encounter for general adult medical examination without abnormal findings: Secondary | ICD-10-CM | POA: Diagnosis not present

## 2017-10-31 DIAGNOSIS — E782 Mixed hyperlipidemia: Secondary | ICD-10-CM | POA: Diagnosis not present

## 2017-10-31 DIAGNOSIS — Z1211 Encounter for screening for malignant neoplasm of colon: Secondary | ICD-10-CM | POA: Diagnosis not present

## 2017-10-31 DIAGNOSIS — Z6841 Body Mass Index (BMI) 40.0 and over, adult: Secondary | ICD-10-CM | POA: Diagnosis not present

## 2017-11-28 ENCOUNTER — Ambulatory Visit (INDEPENDENT_AMBULATORY_CARE_PROVIDER_SITE_OTHER): Payer: Medicare Other | Admitting: Licensed Clinical Social Worker

## 2017-11-28 DIAGNOSIS — F3341 Major depressive disorder, recurrent, in partial remission: Secondary | ICD-10-CM

## 2017-12-12 DIAGNOSIS — G5132 Clonic hemifacial spasm, left: Secondary | ICD-10-CM | POA: Diagnosis not present

## 2017-12-12 DIAGNOSIS — G5131 Clonic hemifacial spasm, right: Secondary | ICD-10-CM | POA: Diagnosis not present

## 2017-12-12 DIAGNOSIS — G245 Blepharospasm: Secondary | ICD-10-CM | POA: Diagnosis not present

## 2017-12-12 DIAGNOSIS — G5133 Clonic hemifacial spasm, bilateral: Secondary | ICD-10-CM | POA: Diagnosis not present

## 2017-12-12 DIAGNOSIS — G244 Idiopathic orofacial dystonia: Secondary | ICD-10-CM | POA: Diagnosis not present

## 2018-01-08 ENCOUNTER — Ambulatory Visit (INDEPENDENT_AMBULATORY_CARE_PROVIDER_SITE_OTHER): Payer: Medicare Other | Admitting: Licensed Clinical Social Worker

## 2018-01-08 DIAGNOSIS — F3341 Major depressive disorder, recurrent, in partial remission: Secondary | ICD-10-CM | POA: Diagnosis not present

## 2018-02-17 ENCOUNTER — Ambulatory Visit (INDEPENDENT_AMBULATORY_CARE_PROVIDER_SITE_OTHER): Payer: Medicare Other | Admitting: Licensed Clinical Social Worker

## 2018-02-17 DIAGNOSIS — F3341 Major depressive disorder, recurrent, in partial remission: Secondary | ICD-10-CM | POA: Diagnosis not present

## 2018-03-17 DIAGNOSIS — G4730 Sleep apnea, unspecified: Secondary | ICD-10-CM | POA: Diagnosis not present

## 2018-03-17 DIAGNOSIS — M19049 Primary osteoarthritis, unspecified hand: Secondary | ICD-10-CM | POA: Diagnosis not present

## 2018-03-17 DIAGNOSIS — M7541 Impingement syndrome of right shoulder: Secondary | ICD-10-CM | POA: Diagnosis not present

## 2018-03-17 DIAGNOSIS — F330 Major depressive disorder, recurrent, mild: Secondary | ICD-10-CM | POA: Diagnosis not present

## 2018-03-17 DIAGNOSIS — Z832 Family history of diseases of the blood and blood-forming organs and certain disorders involving the immune mechanism: Secondary | ICD-10-CM | POA: Diagnosis not present

## 2018-03-17 DIAGNOSIS — M7542 Impingement syndrome of left shoulder: Secondary | ICD-10-CM | POA: Diagnosis not present

## 2018-03-17 DIAGNOSIS — Z6841 Body Mass Index (BMI) 40.0 and over, adult: Secondary | ICD-10-CM | POA: Diagnosis not present

## 2018-03-17 DIAGNOSIS — M2550 Pain in unspecified joint: Secondary | ICD-10-CM | POA: Diagnosis not present

## 2018-03-17 DIAGNOSIS — I10 Essential (primary) hypertension: Secondary | ICD-10-CM | POA: Diagnosis not present

## 2018-03-17 DIAGNOSIS — M19011 Primary osteoarthritis, right shoulder: Secondary | ICD-10-CM | POA: Diagnosis not present

## 2018-03-17 DIAGNOSIS — M19012 Primary osteoarthritis, left shoulder: Secondary | ICD-10-CM | POA: Diagnosis not present

## 2018-04-01 ENCOUNTER — Ambulatory Visit (INDEPENDENT_AMBULATORY_CARE_PROVIDER_SITE_OTHER): Payer: PPO | Admitting: Licensed Clinical Social Worker

## 2018-04-01 DIAGNOSIS — F3341 Major depressive disorder, recurrent, in partial remission: Secondary | ICD-10-CM | POA: Diagnosis not present

## 2018-04-14 DIAGNOSIS — M7542 Impingement syndrome of left shoulder: Secondary | ICD-10-CM | POA: Diagnosis not present

## 2018-04-14 DIAGNOSIS — J069 Acute upper respiratory infection, unspecified: Secondary | ICD-10-CM | POA: Diagnosis not present

## 2018-04-14 DIAGNOSIS — F330 Major depressive disorder, recurrent, mild: Secondary | ICD-10-CM | POA: Diagnosis not present

## 2018-04-14 DIAGNOSIS — M19012 Primary osteoarthritis, left shoulder: Secondary | ICD-10-CM | POA: Diagnosis not present

## 2018-05-06 DIAGNOSIS — I10 Essential (primary) hypertension: Secondary | ICD-10-CM | POA: Diagnosis not present

## 2018-05-06 DIAGNOSIS — F330 Major depressive disorder, recurrent, mild: Secondary | ICD-10-CM | POA: Diagnosis not present

## 2018-05-13 ENCOUNTER — Ambulatory Visit (INDEPENDENT_AMBULATORY_CARE_PROVIDER_SITE_OTHER): Payer: PPO | Admitting: Licensed Clinical Social Worker

## 2018-05-13 DIAGNOSIS — F3341 Major depressive disorder, recurrent, in partial remission: Secondary | ICD-10-CM | POA: Diagnosis not present

## 2018-06-24 ENCOUNTER — Ambulatory Visit: Payer: PPO | Admitting: Licensed Clinical Social Worker

## 2018-07-11 DIAGNOSIS — G4733 Obstructive sleep apnea (adult) (pediatric): Secondary | ICD-10-CM | POA: Diagnosis not present

## 2018-07-11 DIAGNOSIS — I10 Essential (primary) hypertension: Secondary | ICD-10-CM | POA: Diagnosis not present

## 2018-07-11 DIAGNOSIS — E782 Mixed hyperlipidemia: Secondary | ICD-10-CM | POA: Diagnosis not present

## 2018-07-17 ENCOUNTER — Ambulatory Visit (INDEPENDENT_AMBULATORY_CARE_PROVIDER_SITE_OTHER): Payer: PPO | Admitting: Licensed Clinical Social Worker

## 2018-07-17 DIAGNOSIS — F3341 Major depressive disorder, recurrent, in partial remission: Secondary | ICD-10-CM | POA: Diagnosis not present

## 2018-07-18 ENCOUNTER — Other Ambulatory Visit: Payer: Self-pay

## 2018-07-18 NOTE — Patient Outreach (Signed)
[facility]) Care Management  07/18/2018  Kimberly Murray 01/10/1951 [id number]    Late Entry Successful outreach to the patient for HRA/HTA screening follow up.  HIPAA provided by the patient.  The patient declines needing a Control and instrumentation engineer at this time.  After further assessment. No others interventions needed at this time.  Discussed [facility] services with the patient and she agrees to have a follow up call in the six months.  Plan:  RN Health Coach placed the patient in a HRA/HTA  engaged program. Mailed successful letter,welcome letter and advanced directive with EMMI. RN Health Coach will contact patient in the month of October and patient agrees to next outreach.   Juanell Fairly RN, BSN, [facility] RN Health Coach Disease Management [facility] Dial:  [phone]  Fax: [phone]

## 2018-07-21 DIAGNOSIS — G4733 Obstructive sleep apnea (adult) (pediatric): Secondary | ICD-10-CM | POA: Diagnosis not present

## 2018-07-28 ENCOUNTER — Encounter: Payer: Self-pay | Admitting: Internal Medicine

## 2018-07-28 ENCOUNTER — Ambulatory Visit (INDEPENDENT_AMBULATORY_CARE_PROVIDER_SITE_OTHER): Payer: PPO | Admitting: Internal Medicine

## 2018-07-28 ENCOUNTER — Other Ambulatory Visit: Payer: Self-pay

## 2018-07-28 DIAGNOSIS — F5101 Primary insomnia: Secondary | ICD-10-CM

## 2018-07-28 DIAGNOSIS — K219 Gastro-esophageal reflux disease without esophagitis: Secondary | ICD-10-CM | POA: Diagnosis not present

## 2018-07-28 DIAGNOSIS — G4733 Obstructive sleep apnea (adult) (pediatric): Secondary | ICD-10-CM | POA: Diagnosis not present

## 2018-07-28 NOTE — Patient Instructions (Signed)
Ok to continue CPAP 10, mask of choice, humidifier, supplies, Airview/ Card  We discussed the possibility of trying an otc sleep aid occasionally if needed, such as Zzquil.  Please call as needed

## 2018-07-28 NOTE — Progress Notes (Signed)
07/28/2018- 68 yoF never smoker  For sleep evaluation referred courtesy of Dr Rachell Cipro. Medical problem list includes OSA, Hyperlipidemia, Obesity, GERD HST 05/28/16- AHI 16.7/ hr, desaturation to 78%, body weight 230 lbs CPAP titration 07/01/16- 10 cwp CPAP/ 10 DME [facility]  Download compliance 77%, AHI 2.6/ hr- reviewed with her. Body weight today 240 lbs Medical problem list includes HBP, hypercholesterolemia, Headache, GERD She ws snoring w/ witnessed apneas before CPAP. Life quality is better with CPAP, but she "hates it". Current machine 68 yrs old. Admits she's doing better now with nasal pillows.  Some insomnia, no sleep meds.  Hx T MJ surgery 1984 and we agreed she probably wouldn't be a candidate for oral appliance, otw no ENT surgery, heart or lung problems. Using occ Pepcid for reflux. + pill dysphagia.   Prior to Admission medications   Medication Sig Start Date End Date Taking? Authorizing Provider  acetaminophen (TYLENOL) 500 MG tablet Take by mouth.   Yes [provider]  azelastine (ASTELIN) 0.1 % nasal spray SPRAY 2 SPRAYS INTO EACH NOSTRIL TWICE A DAY 04/14/18  Yes [provider]  Cholecalciferol (VITAMIN D) 50 MCG (2000 UT) tablet Take by mouth.   Yes [provider]  diclofenac sodium (VOLTAREN) 1 % GEL APPLY 2 GRAMS TOPICALLY TO AFFECTED AREA 4 TIMES A DAY 03/26/18  Yes [provider]  fenofibrate 160 MG tablet fenofibrate 160 mg tablet 04/19/15  Yes [provider]  hydrochlorothiazide (HYDRODIURIL) 25 MG tablet TAKE 1 TABLET EVERY DAY WITH LOSARTAN 04/07/18  Yes [provider]  losartan-hydrochlorothiazide (HYZAAR) 100-25 MG tablet Take 1 tablet by mouth daily. 07/11/18  Yes [provider]  meclizine (ANTIVERT) 25 MG tablet  04/18/12  Yes [provider]  rosuvastatin (CRESTOR) 5 MG tablet rosuvastatin 5 mg tablet   Yes [provider]  scopolamine (TRANSDERM-SCOP) 1 MG/3DAYS Place onto the  skin.   Yes [provider]  SUMAtriptan (IMITREX) 100 MG tablet Take by mouth.   Yes [provider]  venlafaxine XR (EFFEXOR-XR) 150 MG 24 hr capsule TAKE 1 CAPSULE BY MOUTH EVERY DAY WITH FOOD 07/09/18  Yes [provider]   Past Medical History:  Diagnosis Date  . Allergic rhinitis   . Chronic headaches   . Hyperlipidemia   . Hypertension   . Sleep apnea    Past Surgical History:  Procedure Laterality Date  . CHOLECYSTECTOMY  2006  . LAPAROSCOPIC HYSTERECTOMY  2005   History reviewed. No pertinent family history. Social History   Socioeconomic History  . Marital status: Married    Spouse name: Not on file  . Number of children: Not on file  . Years of education: Not on file  . Highest education level: Not on file  Occupational History  . Not on file  Social Needs  . Financial resource strain: Not on file  . Food insecurity    Worry: Not on file    Inability: Not on file  . Transportation needs    Medical: Not on file    Non-medical: Not on file  Tobacco Use  . Smoking status: Never Smoker  . Smokeless tobacco: Never Used  Substance and Sexual Activity  . Alcohol use: Not on file  . Drug use: Not on file  . Sexual activity: Not on file  Lifestyle  . Physical activity    Days per week: Not on file    Minutes per session: Not on file  . Stress: Not on file  Relationships  .  Social Musicianconnections    Talks on phone: Not on file    Gets together: Not on file    Attends religious service: Not on file    Active member of club or organization: Not on file    Attends meetings of clubs or organizations: Not on file    Relationship status: Not on file  . Intimate partner violence    Fear of current or ex partner: Not on file    Emotionally abused: Not on file    Physically abused: Not on file    Forced sexual activity: Not on file  Other Topics Concern  . Not on file  Social History Narrative  . Not on file   ROS-see HPI   + =  positive Constitutional:    weight loss, night sweats, fevers, chills, fatigue, lassitude. HEENT:    headaches, difficulty swallowing,+ tooth/dental problems, sore throat,       sneezing, itching, ear ache, nasal congestion, post nasal drip, snoring CV:    chest pain, orthopnea, PND, swelling in lower extremities, anasarca,                                  dizziness, palpitations Resp:   shortness of breath with exertion or at rest.                productive cough,   non-productive cough, coughing up of blood.              change in color of mucus.  wheezing.   Skin:    rash or lesions. GI:  No-   heartburn, indigestion, abdominal pain, nausea, vomiting, diarrhea,                 change in bowel habits, loss of appetite GU: dysuria, change in color of urine, no urgency or frequency.   flank pain. MS:   joint pain, stiffness, decreased range of motion, back pain. Neuro-     nothing unusual Psych:  change in mood or affect.  +depression or anxiety.   memory loss.  OBJ- Physical Exam General- Alert, Oriented, Affect-appropriate, Distress- none acute, + obese Skin- rash-none, lesions- none, excoriation- none Lymphadenopathy- none Head- atraumatic            Eyes- Gross vision intact, PERRLA, conjunctivae and secretions clear            Ears- Hearing, canals-normal            Nose- Clear, no-Septal dev, mucus, polyps, erosion, perforation             Throat- Mallampati II , mucosa clear , drainage- none, tonsils- atrophic, + teeth Neck- flexible , trachea midline, no stridor , thyroid nl, carotid no bruit Chest - symmetrical excursion , unlabored           Heart/CV- RRR , no murmur , no gallop  , no rub, nl s1 s2                           - JVD- none , edema- none, stasis changes- none, varices- none           Lung- clear to P&A, wheeze- none, cough- none , dullness-none, rub- none           Chest wall-  Abd-  Br/ Gen/ Rectal- Not done, not indicated Extrem- cyanosis- none, clubbing,  none, atrophy- none,  strength- nl Neuro- grossly intact to observation

## 2018-08-01 DIAGNOSIS — K219 Gastro-esophageal reflux disease without esophagitis: Secondary | ICD-10-CM | POA: Insufficient documentation

## 2018-08-01 DIAGNOSIS — G4733 Obstructive sleep apnea (adult) (pediatric): Secondary | ICD-10-CM | POA: Insufficient documentation

## 2018-08-01 DIAGNOSIS — G4700 Insomnia, unspecified: Secondary | ICD-10-CM | POA: Insufficient documentation

## 2018-08-01 NOTE — Assessment & Plan Note (Signed)
I asked her to discuss her pill-dysphagia w Dr Ernie Hew

## 2018-08-01 NOTE — Assessment & Plan Note (Signed)
She doesn't like her CPAP, but doing better now with nasal pillows and admits she is better off using it. Poor candidate for oral appliance, given hx TMJ problems with surgery. Possibly a future candidate for [facility] eval. Needs time to work on CPAP acceptance and comfort , and to try to lose some weight first.  Plan- When she is eligible for replacement in 3 years probably change to autopap.

## 2018-08-01 NOTE — Assessment & Plan Note (Signed)
Nonspecific occasional difficulty initiating and maintaining sleep. This may tie in to her hx depression and may contribute to her feelings about CPAP. Plan - discussed sleep hygiene and suggested occasional trial of an otc like diphenhydramine/ Zzquil, if helpful.

## 2018-09-23 ENCOUNTER — Other Ambulatory Visit: Payer: Self-pay

## 2018-09-23 NOTE — Patient Outreach (Signed)
[facility]) Care Management  09/23/2018  Delene Morais May 11, 1950 [id number]    Late entry  [facility] closing the program.  Patient is transitioning to external program [facility] for continued case management.  Lazaro Arms RN, BSN, [place] Direct Dial:  [phone]  Fax: [phone]

## 2018-10-13 DIAGNOSIS — E782 Mixed hyperlipidemia: Secondary | ICD-10-CM | POA: Diagnosis not present

## 2018-10-13 DIAGNOSIS — I10 Essential (primary) hypertension: Secondary | ICD-10-CM | POA: Diagnosis not present

## 2018-10-13 DIAGNOSIS — E559 Vitamin D deficiency, unspecified: Secondary | ICD-10-CM | POA: Diagnosis not present

## 2018-10-17 DIAGNOSIS — E559 Vitamin D deficiency, unspecified: Secondary | ICD-10-CM | POA: Diagnosis not present

## 2018-10-17 DIAGNOSIS — E782 Mixed hyperlipidemia: Secondary | ICD-10-CM | POA: Diagnosis not present

## 2018-10-17 DIAGNOSIS — I10 Essential (primary) hypertension: Secondary | ICD-10-CM | POA: Diagnosis not present

## 2018-10-17 DIAGNOSIS — Z6841 Body Mass Index (BMI) 40.0 and over, adult: Secondary | ICD-10-CM | POA: Diagnosis not present

## 2018-10-20 ENCOUNTER — Other Ambulatory Visit: Payer: Self-pay | Admitting: Family Medicine

## 2018-10-20 DIAGNOSIS — M25561 Pain in right knee: Secondary | ICD-10-CM

## 2018-10-20 DIAGNOSIS — Z1159 Encounter for screening for other viral diseases: Secondary | ICD-10-CM | POA: Diagnosis not present

## 2018-10-20 DIAGNOSIS — I739 Peripheral vascular disease, unspecified: Secondary | ICD-10-CM

## 2018-10-21 ENCOUNTER — Other Ambulatory Visit: Payer: Self-pay | Admitting: Family Medicine

## 2018-10-21 ENCOUNTER — Ambulatory Visit (INDEPENDENT_AMBULATORY_CARE_PROVIDER_SITE_OTHER): Payer: PPO | Admitting: Licensed Clinical Social Worker

## 2018-10-21 DIAGNOSIS — F3341 Major depressive disorder, recurrent, in partial remission: Secondary | ICD-10-CM | POA: Diagnosis not present

## 2018-10-21 DIAGNOSIS — M25562 Pain in left knee: Secondary | ICD-10-CM

## 2018-10-21 DIAGNOSIS — M25561 Pain in right knee: Secondary | ICD-10-CM

## 2018-10-21 DIAGNOSIS — I739 Peripheral vascular disease, unspecified: Secondary | ICD-10-CM

## 2018-10-22 ENCOUNTER — Other Ambulatory Visit: Payer: Self-pay

## 2018-10-22 ENCOUNTER — Ambulatory Visit
Admission: RE | Admit: 2018-10-22 | Discharge: 2018-10-22 | Disposition: A | Discharge status: Home / Self Care | Payer: PPO | Source: Ambulatory Visit | Attending: Family Medicine | Admitting: Family Medicine

## 2018-10-22 DIAGNOSIS — I739 Peripheral vascular disease, unspecified: Secondary | ICD-10-CM

## 2018-10-22 DIAGNOSIS — M1712 Unilateral primary osteoarthritis, left knee: Secondary | ICD-10-CM | POA: Diagnosis not present

## 2018-10-22 DIAGNOSIS — M25561 Pain in right knee: Secondary | ICD-10-CM | POA: Diagnosis not present

## 2018-10-22 DIAGNOSIS — M25562 Pain in left knee: Secondary | ICD-10-CM

## 2018-10-22 DIAGNOSIS — M1711 Unilateral primary osteoarthritis, right knee: Secondary | ICD-10-CM | POA: Diagnosis not present

## 2018-10-23 DIAGNOSIS — M7071 Other bursitis of hip, right hip: Secondary | ICD-10-CM | POA: Diagnosis not present

## 2018-10-23 DIAGNOSIS — Z6841 Body Mass Index (BMI) 40.0 and over, adult: Secondary | ICD-10-CM | POA: Diagnosis not present

## 2018-10-23 DIAGNOSIS — M170 Bilateral primary osteoarthritis of knee: Secondary | ICD-10-CM | POA: Diagnosis not present

## 2018-10-23 DIAGNOSIS — M1610 Unilateral primary osteoarthritis, unspecified hip: Secondary | ICD-10-CM | POA: Diagnosis not present

## 2018-10-30 ENCOUNTER — Other Ambulatory Visit: Payer: PPO

## 2018-11-03 DIAGNOSIS — Z0000 Encounter for general adult medical examination without abnormal findings: Secondary | ICD-10-CM | POA: Diagnosis not present

## 2018-11-06 DIAGNOSIS — Z1211 Encounter for screening for malignant neoplasm of colon: Secondary | ICD-10-CM | POA: Diagnosis not present

## 2018-11-06 DIAGNOSIS — M170 Bilateral primary osteoarthritis of knee: Secondary | ICD-10-CM | POA: Diagnosis not present

## 2018-11-06 DIAGNOSIS — Z01419 Encounter for gynecological examination (general) (routine) without abnormal findings: Secondary | ICD-10-CM | POA: Diagnosis not present

## 2018-11-06 DIAGNOSIS — Z23 Encounter for immunization: Secondary | ICD-10-CM | POA: Diagnosis not present

## 2018-11-12 DIAGNOSIS — Z1159 Encounter for screening for other viral diseases: Secondary | ICD-10-CM | POA: Diagnosis not present

## 2018-11-13 DIAGNOSIS — M170 Bilateral primary osteoarthritis of knee: Secondary | ICD-10-CM | POA: Diagnosis not present

## 2018-11-20 DIAGNOSIS — M170 Bilateral primary osteoarthritis of knee: Secondary | ICD-10-CM | POA: Diagnosis not present

## 2018-11-25 ENCOUNTER — Ambulatory Visit (INDEPENDENT_AMBULATORY_CARE_PROVIDER_SITE_OTHER): Payer: PPO | Admitting: Licensed Clinical Social Worker

## 2018-11-25 DIAGNOSIS — F3341 Major depressive disorder, recurrent, in partial remission: Secondary | ICD-10-CM

## 2018-12-04 ENCOUNTER — Ambulatory Visit: Payer: Self-pay

## 2018-12-23 ENCOUNTER — Ambulatory Visit (INDEPENDENT_AMBULATORY_CARE_PROVIDER_SITE_OTHER): Payer: PPO | Admitting: Licensed Clinical Social Worker

## 2018-12-23 DIAGNOSIS — F3341 Major depressive disorder, recurrent, in partial remission: Secondary | ICD-10-CM | POA: Diagnosis not present

## 2019-01-01 DIAGNOSIS — R519 Headache, unspecified: Secondary | ICD-10-CM | POA: Diagnosis not present

## 2019-01-01 DIAGNOSIS — I10 Essential (primary) hypertension: Secondary | ICD-10-CM | POA: Diagnosis not present

## 2019-01-01 DIAGNOSIS — M170 Bilateral primary osteoarthritis of knee: Secondary | ICD-10-CM | POA: Diagnosis not present

## 2019-01-05 ENCOUNTER — Other Ambulatory Visit: Payer: Self-pay

## 2019-01-05 DIAGNOSIS — Z20822 Contact with and (suspected) exposure to covid-19: Secondary | ICD-10-CM

## 2019-01-07 LAB — NOVEL CORONAVIRUS, NAA: SARS-CoV-2, NAA: NOT DETECTED

## 2019-01-12 ENCOUNTER — Other Ambulatory Visit: Payer: Self-pay

## 2019-01-23 ENCOUNTER — Other Ambulatory Visit: Payer: Self-pay

## 2019-01-23 DIAGNOSIS — Z20822 Contact with and (suspected) exposure to covid-19: Secondary | ICD-10-CM

## 2019-01-25 LAB — NOVEL CORONAVIRUS, NAA: SARS-CoV-2, NAA: NOT DETECTED

## 2019-01-28 ENCOUNTER — Encounter: Payer: Self-pay | Admitting: Internal Medicine

## 2019-01-28 ENCOUNTER — Ambulatory Visit (INDEPENDENT_AMBULATORY_CARE_PROVIDER_SITE_OTHER): Payer: PPO | Admitting: Internal Medicine

## 2019-01-28 ENCOUNTER — Other Ambulatory Visit: Payer: Self-pay

## 2019-01-28 DIAGNOSIS — G4733 Obstructive sleep apnea (adult) (pediatric): Secondary | ICD-10-CM | POA: Diagnosis not present

## 2019-01-28 DIAGNOSIS — R519 Headache, unspecified: Secondary | ICD-10-CM

## 2019-01-28 NOTE — Assessment & Plan Note (Signed)
Not a new issue, being managed elsewhere.  We discussed potential relationship of morning HA to OSA, but she says this is not the predominant pattern.

## 2019-01-28 NOTE — Assessment & Plan Note (Signed)
Benefits from CPAP and indicates she feels better off.  Ongoing attention to comfort issues and mask fit. Plan- continue CPAP 10

## 2019-01-28 NOTE — Progress Notes (Signed)
07/28/2018- 68 yoF never smoker  For sleep evaluation referred courtesy of Dr Rachell Cipro. Medical problem list includes OSA, Hyperlipidemia, Obesity, GERD HST 05/28/16- AHI 16.7/ hr, desaturation to 78%, body weight 230 lbs CPAP titration 07/01/16- 10 cwp CPAP/ 10 DME [facility]  Download compliance 77%, AHI 2.6/ hr- reviewed with her. Body weight today 240 lbs Medical problem list includes HBP, hypercholesterolemia, Headache, GERD She ws snoring w/ witnessed apneas before CPAP. Life quality is better with CPAP, but she "hates it". Current machine 68 yrs old. Admits she's doing better now with nasal pillows.  Some insomnia, no sleep meds.  Hx T MJ surgery 1984 and we agreed she probably wouldn't be a candidate for oral appliance, otw no ENT surgery, heart or lung problems. Using occ Pepcid for reflux. + pill dysphagia.   01/28/2019- Virtual Visit via Telephone Note  I connected with Verita Lamb on 01/28/19 at  9:00 AM EST by telephone and verified that I am speaking with the correct person using two identifiers.  Location: Patient: H Provider: O   I discussed the limitations, risks, security and privacy concerns of performing an evaluation and management service by telephone and the availability of in person appointments. I also discussed with the patient that there may be a patient responsible charge related to this service. The patient expressed understanding and agreed to proceed.   History of Present Illness: 68 yoF never smoker folowed for OSA,Insomnia, complicated by Hyperlipidemia, Obesity, GERD CPAP 10/ [facility] Comfortable with CPAP. Does take it off sometimes during night. Being eval for HA- sometimes noted in AM esp if took CPAP off. Nasal pillows- large size may seal better.  No SOB. Had flu vax.    Observations/Objective: Download compliance 93%, AHI  2.7/ hr  Assessment and Plan: OSA- benefits from CPAP. To continue 10 cwp Headache- morning HA can be a symptom of untreated OSA.    Follow Up Instructions: 1 year  I discussed the assessment and treatment plan with the patient. The patient was provided an opportunity to ask questions and all were answered. The patient agreed with the plan and demonstrated an understanding of the instructions.   The patient was advised to call back or seek an in-person evaluation if the symptoms worsen or if the condition fails to improve as anticipated.  I provided 21 minutes of non-face-to-face time during this encounter.   Baird Lyons, MD       ROS-see HPI   + = positive Constitutional:    weight loss, night sweats, fevers, chills, fatigue, lassitude. HEENT:    headaches, difficulty swallowing,+ tooth/dental problems, sore throat,       sneezing, itching, ear ache, nasal congestion, post nasal drip, snoring CV:    chest pain, orthopnea, PND, swelling in lower extremities, anasarca,                                  dizziness, palpitations Resp:   shortness of breath with exertion or at rest.                productive cough,   non-productive cough, coughing up of blood.              change in color of mucus.  wheezing.   Skin:    rash or lesions. GI:  No-   heartburn, indigestion, abdominal pain, nausea, vomiting, diarrhea,  change in bowel habits, loss of appetite GU: dysuria, change in color of urine, no urgency or frequency.   flank pain. MS:   joint pain, stiffness, decreased range of motion, back pain. Neuro-     nothing unusual Psych:  change in mood or affect.  +depression or anxiety.   memory loss.  OBJ- Physical Exam General- Alert, Oriented, Affect-appropriate, Distress- none acute, + obese Skin- rash-none, lesions- none, excoriation- none Lymphadenopathy- none Head- atraumatic            Eyes- Gross vision intact, PERRLA, conjunctivae and secretions clear            Ears- Hearing, canals-normal            Nose- Clear, no-Septal dev, mucus, polyps, erosion, perforation             Throat-  Mallampati II , mucosa clear , drainage- none, tonsils- atrophic, + teeth Neck- flexible , trachea midline, no stridor , thyroid nl, carotid no bruit Chest - symmetrical excursion , unlabored           Heart/CV- RRR , no murmur , no gallop  , no rub, nl s1 s2                           - JVD- none , edema- none, stasis changes- none, varices- none           Lung- clear to P&A, wheeze- none, cough- none , dullness-none, rub- none           Chest wall-  Abd-  Br/ Gen/ Rectal- Not done, not indicated Extrem- cyanosis- none, clubbing, none, atrophy- none, strength- nl Neuro- grossly intact to observation

## 2019-01-28 NOTE — Patient Instructions (Signed)
Order- DME [facility]- We can continue CPAP 10, mask of choice, humidifier, supplies, AirView/ card  Please call if we can help

## 2019-02-03 ENCOUNTER — Ambulatory Visit (INDEPENDENT_AMBULATORY_CARE_PROVIDER_SITE_OTHER): Payer: PPO | Admitting: Licensed Clinical Social Worker

## 2019-02-03 DIAGNOSIS — F3341 Major depressive disorder, recurrent, in partial remission: Secondary | ICD-10-CM

## 2019-02-10 DIAGNOSIS — Z0000 Encounter for general adult medical examination without abnormal findings: Secondary | ICD-10-CM | POA: Diagnosis not present

## 2019-02-18 DIAGNOSIS — M170 Bilateral primary osteoarthritis of knee: Secondary | ICD-10-CM | POA: Diagnosis not present

## 2019-02-18 DIAGNOSIS — I10 Essential (primary) hypertension: Secondary | ICD-10-CM | POA: Diagnosis not present

## 2019-02-18 DIAGNOSIS — R519 Headache, unspecified: Secondary | ICD-10-CM | POA: Diagnosis not present

## 2019-02-18 DIAGNOSIS — R252 Cramp and spasm: Secondary | ICD-10-CM | POA: Diagnosis not present

## 2019-03-17 ENCOUNTER — Ambulatory Visit (INDEPENDENT_AMBULATORY_CARE_PROVIDER_SITE_OTHER): Payer: PPO | Admitting: Licensed Clinical Social Worker

## 2019-03-17 DIAGNOSIS — F3341 Major depressive disorder, recurrent, in partial remission: Secondary | ICD-10-CM

## 2019-04-01 DIAGNOSIS — F411 Generalized anxiety disorder: Secondary | ICD-10-CM | POA: Diagnosis not present

## 2019-04-01 DIAGNOSIS — F330 Major depressive disorder, recurrent, mild: Secondary | ICD-10-CM | POA: Diagnosis not present

## 2019-04-01 DIAGNOSIS — M7541 Impingement syndrome of right shoulder: Secondary | ICD-10-CM | POA: Diagnosis not present

## 2019-04-10 DIAGNOSIS — H52203 Unspecified astigmatism, bilateral: Secondary | ICD-10-CM | POA: Diagnosis not present

## 2019-04-10 DIAGNOSIS — H5213 Myopia, bilateral: Secondary | ICD-10-CM | POA: Diagnosis not present

## 2019-04-10 DIAGNOSIS — H2513 Age-related nuclear cataract, bilateral: Secondary | ICD-10-CM | POA: Diagnosis not present

## 2019-04-27 DIAGNOSIS — F411 Generalized anxiety disorder: Secondary | ICD-10-CM | POA: Diagnosis not present

## 2019-04-27 DIAGNOSIS — F330 Major depressive disorder, recurrent, mild: Secondary | ICD-10-CM | POA: Diagnosis not present

## 2019-04-27 DIAGNOSIS — M7541 Impingement syndrome of right shoulder: Secondary | ICD-10-CM | POA: Diagnosis not present

## 2019-04-28 ENCOUNTER — Ambulatory Visit (INDEPENDENT_AMBULATORY_CARE_PROVIDER_SITE_OTHER): Payer: PPO | Admitting: Licensed Clinical Social Worker

## 2019-04-28 DIAGNOSIS — F3341 Major depressive disorder, recurrent, in partial remission: Secondary | ICD-10-CM | POA: Diagnosis not present

## 2019-05-28 DIAGNOSIS — M170 Bilateral primary osteoarthritis of knee: Secondary | ICD-10-CM | POA: Diagnosis not present

## 2019-05-28 DIAGNOSIS — I10 Essential (primary) hypertension: Secondary | ICD-10-CM | POA: Diagnosis not present

## 2019-05-28 DIAGNOSIS — F330 Major depressive disorder, recurrent, mild: Secondary | ICD-10-CM | POA: Diagnosis not present

## 2019-05-28 DIAGNOSIS — F411 Generalized anxiety disorder: Secondary | ICD-10-CM | POA: Diagnosis not present

## 2019-06-04 ENCOUNTER — Ambulatory Visit: Payer: PPO | Admitting: Orthopaedic Surgery

## 2019-06-04 ENCOUNTER — Encounter: Payer: Self-pay | Admitting: Orthopaedic Surgery

## 2019-06-04 ENCOUNTER — Other Ambulatory Visit: Payer: Self-pay

## 2019-06-04 ENCOUNTER — Ambulatory Visit (INDEPENDENT_AMBULATORY_CARE_PROVIDER_SITE_OTHER): Payer: PPO

## 2019-06-04 VITALS — Ht 64.5 in | Wt 240.8 lb

## 2019-06-04 DIAGNOSIS — M1712 Unilateral primary osteoarthritis, left knee: Secondary | ICD-10-CM

## 2019-06-04 MED ORDER — BUPIVACAINE HCL 0.5 % IJ SOLN
2.0000 mL | INTRAMUSCULAR | Status: AC | PRN
  Administered 2019-06-04: 11:00:00 2 mL via INTRA_ARTICULAR

## 2019-06-04 MED ORDER — METHYLPREDNISOLONE ACETATE 40 MG/ML IJ SUSP
40.0000 mg | INTRAMUSCULAR | Status: AC | PRN
  Administered 2019-06-04: 11:00:00 40 mg via INTRA_ARTICULAR

## 2019-06-04 MED ORDER — LIDOCAINE HCL 1 % IJ SOLN
2.0000 mL | INTRAMUSCULAR | Status: AC | PRN
  Administered 2019-06-04: 2 mL

## 2019-06-04 NOTE — Progress Notes (Signed)
Office Visit Note   Patient: Kimberly Murray           Date of Birth: Mar 13, 1950           MRN: [id number] Visit Date: 06/04/2019              Requested by: Fanny Bien, [address] [place],  [place] [id number] PCP: Fanny Bien, MD   Assessment & Plan: Visit Diagnoses:  1. Primary osteoarthritis of left knee     Plan: My impression is advanced left knee DJD and effusion.  My impression is that her effusion is fairly symptomatic and therefore we talked about aspirating this and repeating cortisone injection which will hopefully give her symptomatic relief.  We briefly discussed the option of a knee replacement down the road should this injection failed to provide her with any meaningful relief.  I aspirated about 15 cc of joint fluid from the knee today.  She will also focus on losing weight as her BMI is just slightly elevated at 40.7.  She will follow-up as needed.  Follow-Up Instructions: Return if symptoms worsen or fail to improve.   Orders:  Orders Placed This Encounter  Procedures  . XR Knee Complete 4 Views Left   No orders of the defined types were placed in this encounter.     Procedures: Large Joint Inj: L knee on 06/04/2019 11:09 AM Details: 22 G needle Medications: 2 mL bupivacaine 0.5 %; 2 mL lidocaine 1 %; 40 mg methylPREDNISolone acetate 40 MG/ML Outcome: tolerated well, no immediate complications Patient was prepped and draped in the usual sterile fashion.       Clinical Data: No additional findings.   Subjective: Chief Complaint  Patient presents with  . Left Knee - Pain    Kimberly Murray is a 69 year old female who is a referral from Dr. Ernie Hew for consultation and evaluation of the left knee DJD.  She has had 2 steroid injections and viscosupplementation injections without significant relief most recently.  She is very limited functional standpoint.  She is currently retired and her activity.  She has chronic nighttime pain as well.  She  feels some crunching and swelling and she takes Tylenol at night to help her sleep.  She reports no previous knee surgeries.   Review of Systems  Constitutional: Negative.   HENT: Negative.   Eyes: Negative.   Respiratory: Negative.   Cardiovascular: Negative.   Endocrine: Negative.   Musculoskeletal: Negative.   Neurological: Negative.   Hematological: Negative.   Psychiatric/Behavioral: Negative.   All other systems reviewed and are negative.    Objective: Vital Signs: Ht 5' 4.5" (1.638 m)   Wt 240 lb 12.8 oz (109.2 kg)   BMI 40.69 kg/m   Physical Exam Vitals and nursing note reviewed.  Constitutional:      Appearance: She is well-developed.  Pulmonary:     Effort: Pulmonary effort is normal.  Skin:    General: Skin is warm.     Capillary Refill: Capillary refill takes less than 2 seconds.  Neurological:     Mental Status: She is alert and oriented to person, place, and time.  Psychiatric:        Behavior: Behavior normal.        Thought Content: Thought content normal.        Judgment: Judgment normal.     Ortho Exam Left knee reveals a small joint effusion.  Limited range of motion secondary to pain and effusion.  Collaterals and cruciates are stable.  Patellofemoral crepitus.  Medial joint line tenderness. Specialty Comments:  No specialty comments available.  Imaging: XR Knee Complete 4 Views Left  Result Date: 06/04/2019 Bone-on-bone joint space narrowing medial compartment.    PMFS History: Patient Active Problem List   Diagnosis Date Noted  . Headache 01/28/2019  . Obstructive sleep apnea 08/01/2018  . Insomnia 08/01/2018  . GERD (gastroesophageal reflux disease) 08/01/2018   Past Medical History:  Diagnosis Date  . Allergic rhinitis   . Chronic headaches   . Hyperlipidemia   . Hypertension   . Sleep apnea     History reviewed. No pertinent family history.  Past Surgical History:  Procedure Laterality Date  . CHOLECYSTECTOMY  2006  .  LAPAROSCOPIC HYSTERECTOMY  2005   Social History   Occupational History  . Not on file  Tobacco Use  . Smoking status: Never Smoker  . Smokeless tobacco: Never Used  Substance and Sexual Activity  . Alcohol use: Not on file  . Drug use: Not on file  . Sexual activity: Not on file

## 2019-06-25 DIAGNOSIS — E782 Mixed hyperlipidemia: Secondary | ICD-10-CM | POA: Diagnosis not present

## 2019-06-25 DIAGNOSIS — R252 Cramp and spasm: Secondary | ICD-10-CM | POA: Diagnosis not present

## 2019-06-25 DIAGNOSIS — J309 Allergic rhinitis, unspecified: Secondary | ICD-10-CM | POA: Diagnosis not present

## 2019-08-17 DIAGNOSIS — I10 Essential (primary) hypertension: Secondary | ICD-10-CM | POA: Diagnosis not present

## 2019-08-17 DIAGNOSIS — E782 Mixed hyperlipidemia: Secondary | ICD-10-CM | POA: Diagnosis not present

## 2019-08-17 DIAGNOSIS — R252 Cramp and spasm: Secondary | ICD-10-CM | POA: Diagnosis not present

## 2019-08-19 ENCOUNTER — Other Ambulatory Visit: Payer: Self-pay | Admitting: Family Medicine

## 2019-08-19 DIAGNOSIS — Z1231 Encounter for screening mammogram for malignant neoplasm of breast: Secondary | ICD-10-CM

## 2019-08-21 DIAGNOSIS — J309 Allergic rhinitis, unspecified: Secondary | ICD-10-CM | POA: Diagnosis not present

## 2019-08-21 DIAGNOSIS — E782 Mixed hyperlipidemia: Secondary | ICD-10-CM | POA: Diagnosis not present

## 2019-08-21 DIAGNOSIS — E669 Obesity, unspecified: Secondary | ICD-10-CM | POA: Diagnosis not present

## 2019-08-21 DIAGNOSIS — F339 Major depressive disorder, recurrent, unspecified: Secondary | ICD-10-CM | POA: Diagnosis not present

## 2019-08-21 DIAGNOSIS — R739 Hyperglycemia, unspecified: Secondary | ICD-10-CM | POA: Diagnosis not present

## 2019-08-21 DIAGNOSIS — G4733 Obstructive sleep apnea (adult) (pediatric): Secondary | ICD-10-CM | POA: Diagnosis not present

## 2019-08-21 DIAGNOSIS — R5383 Other fatigue: Secondary | ICD-10-CM | POA: Diagnosis not present

## 2019-08-28 ENCOUNTER — Ambulatory Visit
Admission: RE | Admit: 2019-08-28 | Discharge: 2019-08-28 | Disposition: A | Discharge status: Home / Self Care | Payer: PPO | Source: Ambulatory Visit | Attending: Family Medicine | Admitting: Family Medicine

## 2019-08-28 ENCOUNTER — Other Ambulatory Visit: Payer: Self-pay

## 2019-08-28 DIAGNOSIS — Z1231 Encounter for screening mammogram for malignant neoplasm of breast: Secondary | ICD-10-CM | POA: Diagnosis not present

## 2019-09-04 DIAGNOSIS — W57XXXA Bitten or stung by nonvenomous insect and other nonvenomous arthropods, initial encounter: Secondary | ICD-10-CM | POA: Diagnosis not present

## 2019-09-04 DIAGNOSIS — S40869A Insect bite (nonvenomous) of unspecified upper arm, initial encounter: Secondary | ICD-10-CM | POA: Diagnosis not present

## 2019-09-15 ENCOUNTER — Other Ambulatory Visit: Payer: Self-pay

## 2019-09-30 DIAGNOSIS — F339 Major depressive disorder, recurrent, unspecified: Secondary | ICD-10-CM | POA: Diagnosis not present

## 2019-09-30 DIAGNOSIS — R739 Hyperglycemia, unspecified: Secondary | ICD-10-CM | POA: Diagnosis not present

## 2019-09-30 DIAGNOSIS — E782 Mixed hyperlipidemia: Secondary | ICD-10-CM | POA: Diagnosis not present

## 2019-10-01 DIAGNOSIS — T466X5A Adverse effect of antihyperlipidemic and antiarteriosclerotic drugs, initial encounter: Secondary | ICD-10-CM | POA: Diagnosis not present

## 2019-10-01 DIAGNOSIS — E1169 Type 2 diabetes mellitus with other specified complication: Secondary | ICD-10-CM | POA: Diagnosis not present

## 2019-10-01 DIAGNOSIS — G4733 Obstructive sleep apnea (adult) (pediatric): Secondary | ICD-10-CM | POA: Diagnosis not present

## 2019-10-01 DIAGNOSIS — E782 Mixed hyperlipidemia: Secondary | ICD-10-CM | POA: Diagnosis not present

## 2019-10-01 DIAGNOSIS — F330 Major depressive disorder, recurrent, mild: Secondary | ICD-10-CM | POA: Diagnosis not present

## 2019-10-01 DIAGNOSIS — M791 Myalgia, unspecified site: Secondary | ICD-10-CM | POA: Diagnosis not present

## 2019-10-01 DIAGNOSIS — E1165 Type 2 diabetes mellitus with hyperglycemia: Secondary | ICD-10-CM | POA: Diagnosis not present

## 2019-10-01 DIAGNOSIS — G4700 Insomnia, unspecified: Secondary | ICD-10-CM | POA: Diagnosis not present

## 2019-10-01 DIAGNOSIS — F411 Generalized anxiety disorder: Secondary | ICD-10-CM | POA: Diagnosis not present

## 2019-10-01 DIAGNOSIS — R739 Hyperglycemia, unspecified: Secondary | ICD-10-CM | POA: Diagnosis not present

## 2019-10-12 DIAGNOSIS — E1165 Type 2 diabetes mellitus with hyperglycemia: Secondary | ICD-10-CM | POA: Diagnosis not present

## 2019-10-12 DIAGNOSIS — E6609 Other obesity due to excess calories: Secondary | ICD-10-CM | POA: Diagnosis not present

## 2019-10-12 DIAGNOSIS — E782 Mixed hyperlipidemia: Secondary | ICD-10-CM | POA: Diagnosis not present

## 2019-11-13 DIAGNOSIS — Z23 Encounter for immunization: Secondary | ICD-10-CM | POA: Diagnosis not present

## 2019-11-13 DIAGNOSIS — E782 Mixed hyperlipidemia: Secondary | ICD-10-CM | POA: Diagnosis not present

## 2019-11-18 DIAGNOSIS — E782 Mixed hyperlipidemia: Secondary | ICD-10-CM | POA: Diagnosis not present

## 2019-11-18 DIAGNOSIS — M170 Bilateral primary osteoarthritis of knee: Secondary | ICD-10-CM | POA: Diagnosis not present

## 2019-11-18 DIAGNOSIS — F411 Generalized anxiety disorder: Secondary | ICD-10-CM | POA: Diagnosis not present

## 2019-11-18 DIAGNOSIS — I10 Essential (primary) hypertension: Secondary | ICD-10-CM | POA: Diagnosis not present

## 2019-11-19 DIAGNOSIS — M1711 Unilateral primary osteoarthritis, right knee: Secondary | ICD-10-CM | POA: Diagnosis not present

## 2019-11-19 DIAGNOSIS — M170 Bilateral primary osteoarthritis of knee: Secondary | ICD-10-CM | POA: Diagnosis not present

## 2019-11-19 DIAGNOSIS — M1712 Unilateral primary osteoarthritis, left knee: Secondary | ICD-10-CM | POA: Diagnosis not present

## 2019-11-25 DIAGNOSIS — E6609 Other obesity due to excess calories: Secondary | ICD-10-CM | POA: Diagnosis not present

## 2019-11-25 DIAGNOSIS — E1165 Type 2 diabetes mellitus with hyperglycemia: Secondary | ICD-10-CM | POA: Diagnosis not present

## 2019-11-25 DIAGNOSIS — E782 Mixed hyperlipidemia: Secondary | ICD-10-CM | POA: Diagnosis not present

## 2019-12-09 DIAGNOSIS — Z1339 Encounter for screening examination for other mental health and behavioral disorders: Secondary | ICD-10-CM | POA: Diagnosis not present

## 2019-12-09 DIAGNOSIS — E782 Mixed hyperlipidemia: Secondary | ICD-10-CM | POA: Diagnosis not present

## 2019-12-09 DIAGNOSIS — Z1331 Encounter for screening for depression: Secondary | ICD-10-CM | POA: Diagnosis not present

## 2019-12-09 DIAGNOSIS — Z0000 Encounter for general adult medical examination without abnormal findings: Secondary | ICD-10-CM | POA: Diagnosis not present

## 2019-12-22 ENCOUNTER — Other Ambulatory Visit: Payer: Self-pay | Admitting: Family Medicine

## 2019-12-22 DIAGNOSIS — M810 Age-related osteoporosis without current pathological fracture: Secondary | ICD-10-CM

## 2020-01-07 DIAGNOSIS — G4733 Obstructive sleep apnea (adult) (pediatric): Secondary | ICD-10-CM | POA: Diagnosis not present

## 2020-01-20 DIAGNOSIS — M170 Bilateral primary osteoarthritis of knee: Secondary | ICD-10-CM | POA: Diagnosis not present

## 2020-01-27 ENCOUNTER — Encounter: Payer: Self-pay | Admitting: Internal Medicine

## 2020-01-27 DIAGNOSIS — M170 Bilateral primary osteoarthritis of knee: Secondary | ICD-10-CM | POA: Diagnosis not present

## 2020-01-28 NOTE — Progress Notes (Signed)
HPI F never smoker folowed for OSA,Insomnia, complicated by Hyperlipidemia, Obesity, GERD CPAP 10/ [facility] HST 05/28/16- AHI 16.7/ hr, desaturation to 78%, body weight 230 lbs  ========================================================  01/28/2019- Virtual Visit via Telephone Note   History of Present Illness: 62 yoF never smoker folowed for OSA,Insomnia, complicated by Hyperlipidemia, Obesity, GERD CPAP 10/ [facility] Comfortable with CPAP. Does take it off sometimes during night. Being eval for HA- sometimes noted in AM esp if took CPAP off. Nasal pillows- large size may seal better.  No SOB. Had flu vax.    Observations/Objective: Download compliance 93%, AHI  2.7/ hr  Assessment and Plan: OSA- benefits from CPAP. To continue 10 cwp Headache- morning HA can be a symptom of untreated OSA.   Follow Up Instructions: 1 year    01/29/20- 69 yoF never smoker folowed for OSA,Insomnia, complicated by Hyperlipidemia, Obesity, GERD CPAP 10/ [facility] Download- compliance 83%, AHI 2.5/ hr Body weight today- 232 lbs Covid vax- 3 Phizer Flu vax-had -----Patient is not sleeping well lately and feels like it is hard to swallow when she is laying down and sometimes chokes.  Reviewed download. CPAP ok, but for 6-12 months is waking during night with difficulty getting back to sleep. Gets up and reads. Drinks some coffee as late as suppertime- discussed.  Occ swallows wrong, chokes easily. At night lying on R side, is apt to feel unable to swallow. Discussed potential need for MBS/ Speech Therapy, or GI.  ROS-see HPI   + = positive Constitutional:    weight loss, night sweats, fevers, chills, fatigue, lassitude. HEENT:    headaches, +difficulty swallowing,+ tooth/dental problems, sore throat,       sneezing, itching, ear ache, nasal congestion, post nasal drip, snoring CV:    chest pain, orthopnea, PND, swelling in lower extremities, anasarca,                                  dizziness,  palpitations Resp:   shortness of breath with exertion or at rest.                productive cough,   non-productive cough, coughing up of blood.              change in color of mucus.  wheezing.   Skin:    rash or lesions. GI:  No-   heartburn, indigestion, abdominal pain, nausea, vomiting, diarrhea,                 change in bowel habits, loss of appetite GU: dysuria, change in color of urine, no urgency or frequency.   flank pain. MS:   joint pain, stiffness, decreased range of motion, back pain. Neuro-     nothing unusual Psych:  change in mood or affect.  +depression or anxiety.   memory loss.  OBJ- Physical Exam General- Alert, Oriented, Affect-appropriate, Distress- none acute, + obese Skin- rash-none, lesions- none, excoriation- none Lymphadenopathy- none Head- atraumatic            Eyes- Gross vision intact, PERRLA, conjunctivae and secretions clear            Ears- Hearing, canals-normal            Nose- Clear, no-Septal dev, mucus, polyps, erosion, perforation             Throat- Mallampati II , mucosa clear , drainage- none, tonsils- atrophic, + teeth Neck- flexible , trachea  midline, no stridor , thyroid nl, carotid no bruit Chest - symmetrical excursion , unlabored           Heart/CV- RRR , no murmur , no gallop  , no rub, nl s1 s2                           - JVD- none , edema- none, stasis changes- none, varices- none           Lung- clear to P&A, wheeze- none, cough- none , dullness-none, rub- none           Chest wall-  Abd-  Br/ Gen/ Rectal- Not done, not indicated Extrem- cyanosis- none, clubbing, none, atrophy- none, strength- nl Neuro- grossly intact to observation

## 2020-01-29 ENCOUNTER — Other Ambulatory Visit: Payer: Self-pay

## 2020-01-29 ENCOUNTER — Encounter: Payer: Self-pay | Admitting: Internal Medicine

## 2020-01-29 ENCOUNTER — Ambulatory Visit: Payer: PPO | Admitting: Internal Medicine

## 2020-01-29 DIAGNOSIS — F5101 Primary insomnia: Secondary | ICD-10-CM

## 2020-01-29 DIAGNOSIS — G4733 Obstructive sleep apnea (adult) (pediatric): Secondary | ICD-10-CM

## 2020-01-29 DIAGNOSIS — K219 Gastro-esophageal reflux disease without esophagitis: Secondary | ICD-10-CM | POA: Diagnosis not present

## 2020-01-29 MED ORDER — TEMAZEPAM 15 MG PO CAPS: 15.0000 mg | ORAL_CAPSULE | Freq: Every evening | ORAL | 5 refills | Status: AC | PRN

## 2020-01-29 NOTE — Patient Instructions (Signed)
We can continue CPAP 10  Script sent for temazepam to see if this can help your sleep when needed You can try taking it maybe 20 -30 minutes before you want to sleep.  Try elevating the head end of your bed frame with a brick under each of the head legs. See if this makes the swallowing discomfort easier.  It will cut down your tendency to reflux at night.  Consider changing to decaf earlier in the evening  Please call if we can help

## 2020-01-31 NOTE — Assessment & Plan Note (Addendum)
Reviewed basics Plan- try temazepam 15 mg occasionally

## 2020-01-31 NOTE — Assessment & Plan Note (Signed)
Some positional dysphagia at night Plan- try elevating HOB as discussed. Consider GI.

## 2020-01-31 NOTE — Assessment & Plan Note (Signed)
Benefits from CPAP with good compliance and control Plan- continue fixed 10

## 2020-02-03 DIAGNOSIS — M170 Bilateral primary osteoarthritis of knee: Secondary | ICD-10-CM | POA: Diagnosis not present

## 2020-03-17 IMAGING — CR DG KNEE 1-2V*R*
2 series · 2 of 2 positions shown · non-contrast
Comparison: None.

CLINICAL DATA: Knee pain, no known injury, initial encounter

EXAM:
RIGHT KNEE - 2 VIEW

[t knee ap right]
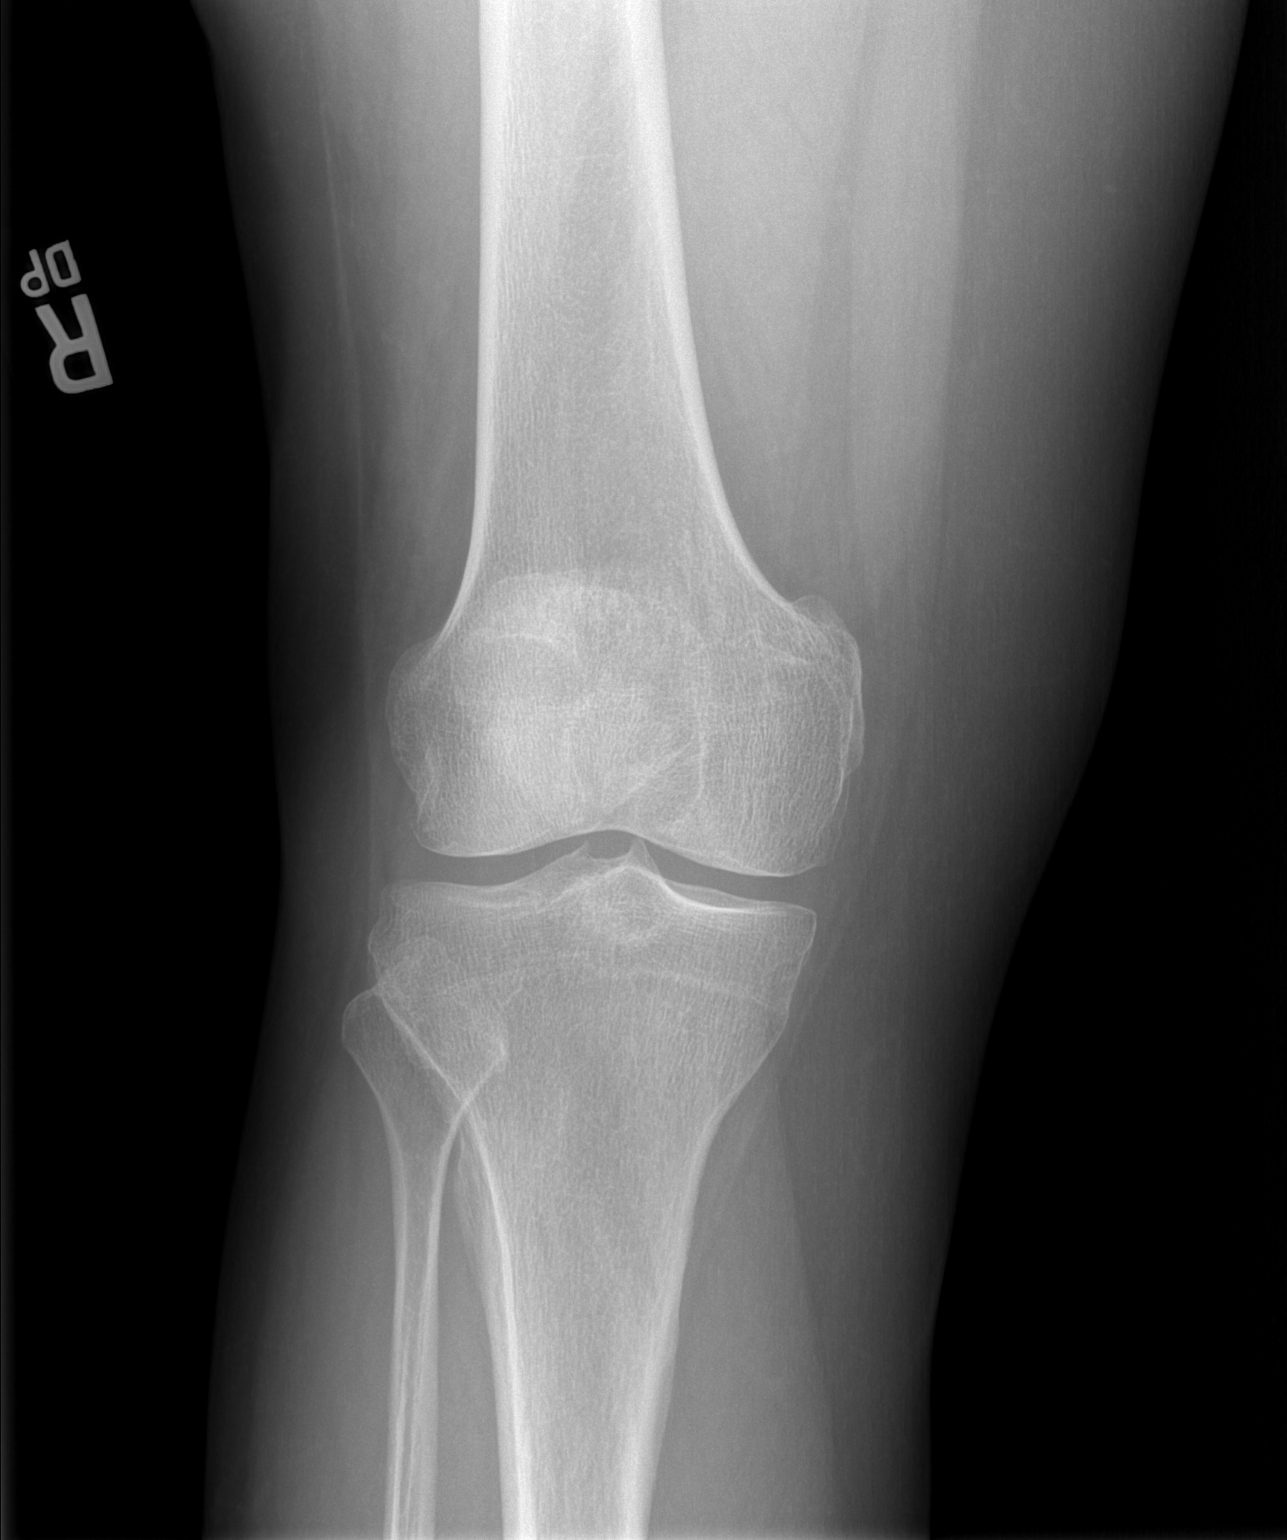

[t knee lat right]
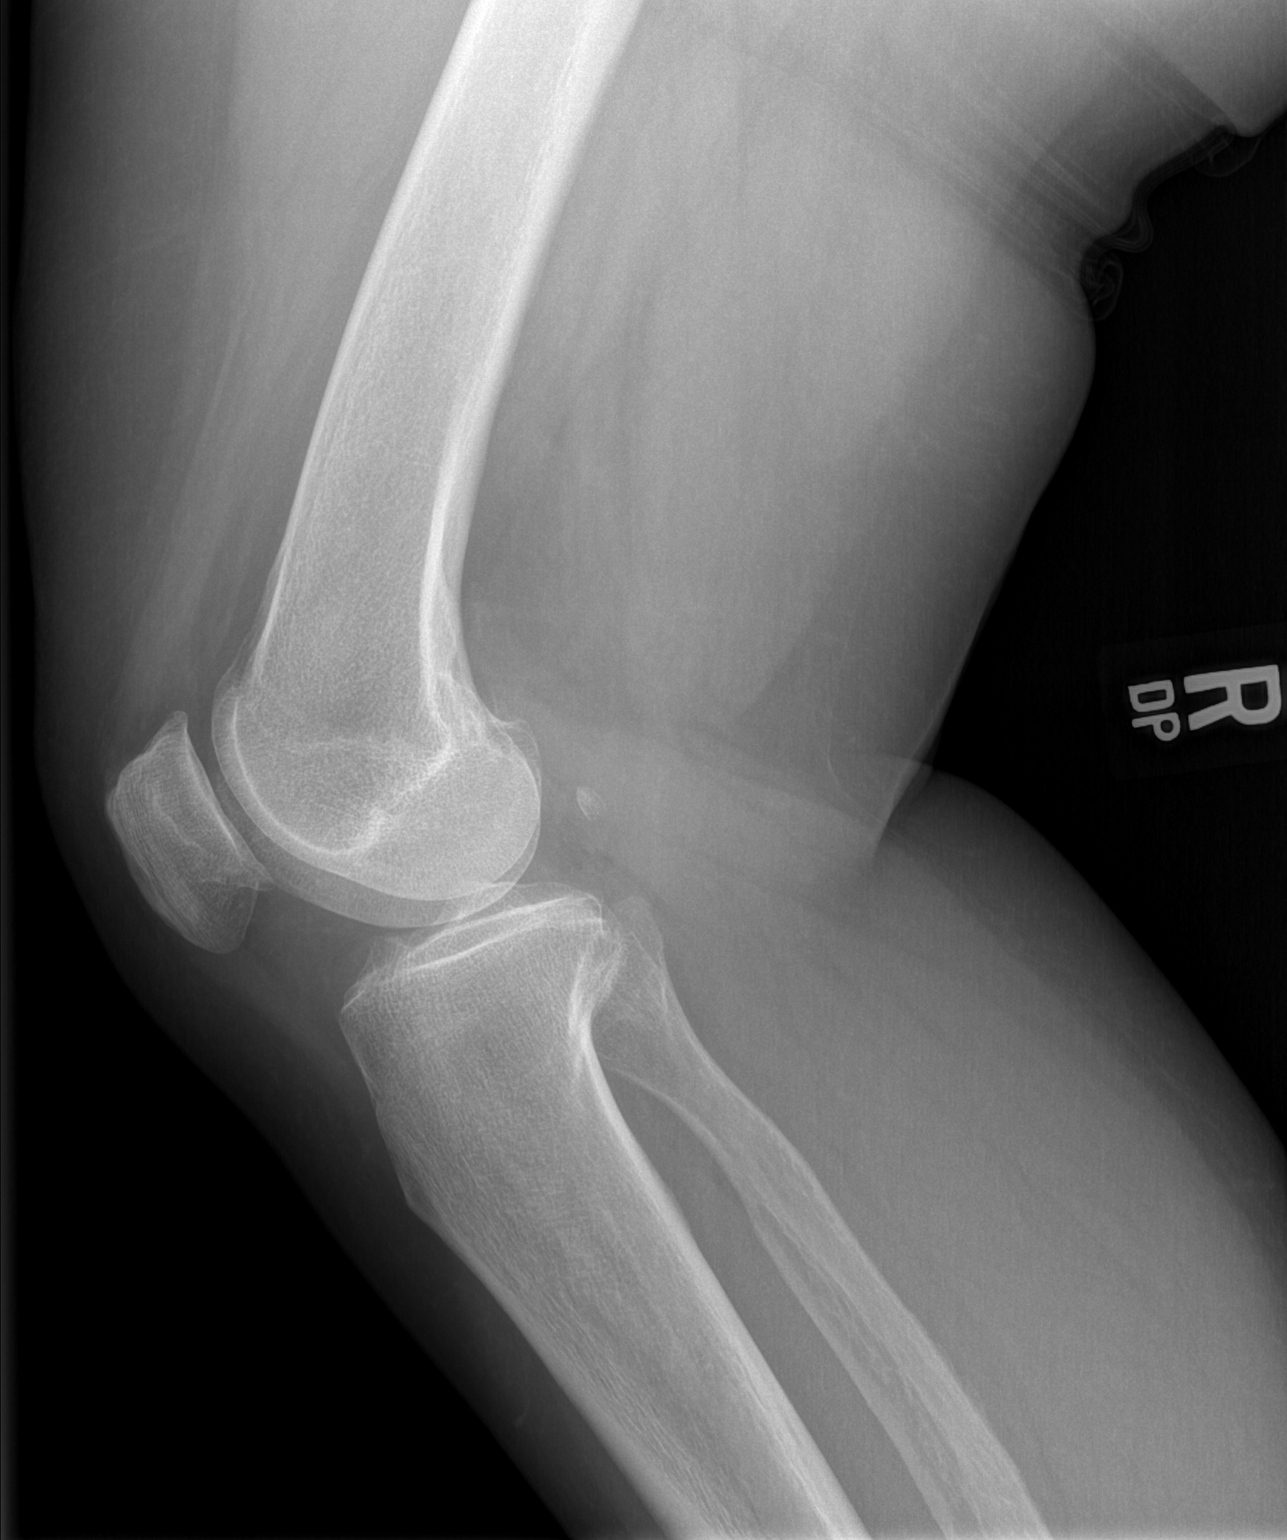

[2 of 2 positions shown; findings below may reference images not displayed]

FINDINGS: Mild degenerative changes are noted most marked in the
patellofemoral space. No joint effusion is seen. No acute fracture
or dislocation is noted.
IMPRESSION: Degenerative change without acute abnormality.

## 2020-03-25 DIAGNOSIS — R739 Hyperglycemia, unspecified: Secondary | ICD-10-CM | POA: Diagnosis not present

## 2020-03-29 DIAGNOSIS — E1165 Type 2 diabetes mellitus with hyperglycemia: Secondary | ICD-10-CM | POA: Diagnosis not present

## 2020-03-29 DIAGNOSIS — F411 Generalized anxiety disorder: Secondary | ICD-10-CM | POA: Diagnosis not present

## 2020-03-29 DIAGNOSIS — F331 Major depressive disorder, recurrent, moderate: Secondary | ICD-10-CM | POA: Diagnosis not present

## 2020-03-29 DIAGNOSIS — G4733 Obstructive sleep apnea (adult) (pediatric): Secondary | ICD-10-CM | POA: Diagnosis not present

## 2020-03-29 DIAGNOSIS — G4700 Insomnia, unspecified: Secondary | ICD-10-CM | POA: Diagnosis not present

## 2020-03-30 ENCOUNTER — Other Ambulatory Visit: Payer: PPO

## 2020-04-11 DIAGNOSIS — H5213 Myopia, bilateral: Secondary | ICD-10-CM | POA: Diagnosis not present

## 2020-04-11 DIAGNOSIS — H35371 Puckering of macula, right eye: Secondary | ICD-10-CM | POA: Diagnosis not present

## 2020-04-11 DIAGNOSIS — H33301 Unspecified retinal break, right eye: Secondary | ICD-10-CM | POA: Diagnosis not present

## 2020-04-15 DIAGNOSIS — H35371 Puckering of macula, right eye: Secondary | ICD-10-CM | POA: Diagnosis not present

## 2020-04-15 DIAGNOSIS — H35341 Macular cyst, hole, or pseudohole, right eye: Secondary | ICD-10-CM | POA: Diagnosis not present

## 2020-04-15 DIAGNOSIS — H43813 Vitreous degeneration, bilateral: Secondary | ICD-10-CM | POA: Diagnosis not present

## 2020-04-15 DIAGNOSIS — H43391 Other vitreous opacities, right eye: Secondary | ICD-10-CM | POA: Diagnosis not present

## 2020-04-28 DIAGNOSIS — H35371 Puckering of macula, right eye: Secondary | ICD-10-CM | POA: Diagnosis not present

## 2020-04-28 DIAGNOSIS — H3581 Retinal edema: Secondary | ICD-10-CM | POA: Diagnosis not present

## 2020-05-04 DIAGNOSIS — H35371 Puckering of macula, right eye: Secondary | ICD-10-CM | POA: Diagnosis not present

## 2020-05-25 DIAGNOSIS — H43812 Vitreous degeneration, left eye: Secondary | ICD-10-CM | POA: Diagnosis not present

## 2020-05-25 DIAGNOSIS — H3581 Retinal edema: Secondary | ICD-10-CM | POA: Diagnosis not present

## 2020-06-07 ENCOUNTER — Ambulatory Visit
Admission: RE | Admit: 2020-06-07 | Discharge: 2020-06-07 | Disposition: A | Discharge status: Home / Self Care | Payer: PPO | Source: Ambulatory Visit | Attending: Family Medicine | Admitting: Family Medicine

## 2020-06-07 ENCOUNTER — Other Ambulatory Visit: Payer: Self-pay

## 2020-06-07 DIAGNOSIS — M810 Age-related osteoporosis without current pathological fracture: Secondary | ICD-10-CM

## 2020-06-07 DIAGNOSIS — Z780 Asymptomatic menopausal state: Secondary | ICD-10-CM | POA: Diagnosis not present

## 2020-07-27 DIAGNOSIS — I152 Hypertension secondary to endocrine disorders: Secondary | ICD-10-CM | POA: Diagnosis not present

## 2020-07-27 DIAGNOSIS — M170 Bilateral primary osteoarthritis of knee: Secondary | ICD-10-CM | POA: Diagnosis not present

## 2020-07-27 DIAGNOSIS — E1165 Type 2 diabetes mellitus with hyperglycemia: Secondary | ICD-10-CM | POA: Diagnosis not present

## 2020-07-27 DIAGNOSIS — I10 Essential (primary) hypertension: Secondary | ICD-10-CM | POA: Diagnosis not present

## 2020-07-27 DIAGNOSIS — R739 Hyperglycemia, unspecified: Secondary | ICD-10-CM | POA: Diagnosis not present

## 2020-07-27 DIAGNOSIS — E782 Mixed hyperlipidemia: Secondary | ICD-10-CM | POA: Diagnosis not present

## 2020-07-28 DIAGNOSIS — M1712 Unilateral primary osteoarthritis, left knee: Secondary | ICD-10-CM | POA: Diagnosis not present

## 2020-07-28 DIAGNOSIS — Z23 Encounter for immunization: Secondary | ICD-10-CM | POA: Diagnosis not present

## 2020-07-28 DIAGNOSIS — L309 Dermatitis, unspecified: Secondary | ICD-10-CM | POA: Diagnosis not present

## 2020-07-28 DIAGNOSIS — M1711 Unilateral primary osteoarthritis, right knee: Secondary | ICD-10-CM | POA: Diagnosis not present

## 2020-07-28 DIAGNOSIS — M170 Bilateral primary osteoarthritis of knee: Secondary | ICD-10-CM | POA: Diagnosis not present

## 2020-07-29 DIAGNOSIS — E782 Mixed hyperlipidemia: Secondary | ICD-10-CM | POA: Diagnosis not present

## 2020-07-29 DIAGNOSIS — I10 Essential (primary) hypertension: Secondary | ICD-10-CM | POA: Diagnosis not present

## 2020-07-29 DIAGNOSIS — E559 Vitamin D deficiency, unspecified: Secondary | ICD-10-CM | POA: Diagnosis not present

## 2020-07-29 DIAGNOSIS — R739 Hyperglycemia, unspecified: Secondary | ICD-10-CM | POA: Diagnosis not present

## 2020-08-03 DIAGNOSIS — E039 Hypothyroidism, unspecified: Secondary | ICD-10-CM | POA: Diagnosis not present

## 2020-08-03 DIAGNOSIS — E669 Obesity, unspecified: Secondary | ICD-10-CM | POA: Diagnosis not present

## 2020-08-03 DIAGNOSIS — E559 Vitamin D deficiency, unspecified: Secondary | ICD-10-CM | POA: Diagnosis not present

## 2020-08-03 DIAGNOSIS — Z6838 Body mass index (BMI) 38.0-38.9, adult: Secondary | ICD-10-CM | POA: Diagnosis not present

## 2020-08-03 DIAGNOSIS — R519 Headache, unspecified: Secondary | ICD-10-CM | POA: Diagnosis not present

## 2020-08-03 DIAGNOSIS — E782 Mixed hyperlipidemia: Secondary | ICD-10-CM | POA: Diagnosis not present

## 2020-08-03 DIAGNOSIS — R7303 Prediabetes: Secondary | ICD-10-CM | POA: Diagnosis not present

## 2020-08-11 DIAGNOSIS — M170 Bilateral primary osteoarthritis of knee: Secondary | ICD-10-CM | POA: Diagnosis not present

## 2020-08-18 DIAGNOSIS — M170 Bilateral primary osteoarthritis of knee: Secondary | ICD-10-CM | POA: Diagnosis not present

## 2020-08-24 DIAGNOSIS — H3581 Retinal edema: Secondary | ICD-10-CM | POA: Diagnosis not present

## 2020-08-24 DIAGNOSIS — H43812 Vitreous degeneration, left eye: Secondary | ICD-10-CM | POA: Diagnosis not present

## 2020-08-24 DIAGNOSIS — H2513 Age-related nuclear cataract, bilateral: Secondary | ICD-10-CM | POA: Diagnosis not present

## 2020-08-25 DIAGNOSIS — M170 Bilateral primary osteoarthritis of knee: Secondary | ICD-10-CM | POA: Diagnosis not present

## 2020-09-28 DIAGNOSIS — G4733 Obstructive sleep apnea (adult) (pediatric): Secondary | ICD-10-CM | POA: Diagnosis not present

## 2020-11-03 DIAGNOSIS — E1165 Type 2 diabetes mellitus with hyperglycemia: Secondary | ICD-10-CM | POA: Diagnosis not present

## 2020-11-04 DIAGNOSIS — R7303 Prediabetes: Secondary | ICD-10-CM | POA: Diagnosis not present

## 2020-11-04 DIAGNOSIS — G629 Polyneuropathy, unspecified: Secondary | ICD-10-CM | POA: Diagnosis not present

## 2020-11-29 DIAGNOSIS — Z23 Encounter for immunization: Secondary | ICD-10-CM | POA: Diagnosis not present

## 2020-11-29 DIAGNOSIS — Z7185 Encounter for immunization safety counseling: Secondary | ICD-10-CM | POA: Diagnosis not present

## 2020-11-29 DIAGNOSIS — M170 Bilateral primary osteoarthritis of knee: Secondary | ICD-10-CM | POA: Diagnosis not present

## 2020-11-29 DIAGNOSIS — L309 Dermatitis, unspecified: Secondary | ICD-10-CM | POA: Diagnosis not present

## 2020-11-29 DIAGNOSIS — M1711 Unilateral primary osteoarthritis, right knee: Secondary | ICD-10-CM | POA: Diagnosis not present

## 2020-11-29 DIAGNOSIS — M1712 Unilateral primary osteoarthritis, left knee: Secondary | ICD-10-CM | POA: Diagnosis not present

## 2020-12-15 DIAGNOSIS — F411 Generalized anxiety disorder: Secondary | ICD-10-CM | POA: Diagnosis not present

## 2020-12-15 DIAGNOSIS — Z0000 Encounter for general adult medical examination without abnormal findings: Secondary | ICD-10-CM | POA: Diagnosis not present

## 2020-12-15 DIAGNOSIS — G4733 Obstructive sleep apnea (adult) (pediatric): Secondary | ICD-10-CM | POA: Diagnosis not present

## 2020-12-15 DIAGNOSIS — M170 Bilateral primary osteoarthritis of knee: Secondary | ICD-10-CM | POA: Diagnosis not present

## 2020-12-15 DIAGNOSIS — E782 Mixed hyperlipidemia: Secondary | ICD-10-CM | POA: Diagnosis not present

## 2020-12-15 DIAGNOSIS — I10 Essential (primary) hypertension: Secondary | ICD-10-CM | POA: Diagnosis not present

## 2020-12-15 DIAGNOSIS — Z1339 Encounter for screening examination for other mental health and behavioral disorders: Secondary | ICD-10-CM | POA: Diagnosis not present

## 2020-12-15 DIAGNOSIS — Z1331 Encounter for screening for depression: Secondary | ICD-10-CM | POA: Diagnosis not present

## 2020-12-21 DIAGNOSIS — H43812 Vitreous degeneration, left eye: Secondary | ICD-10-CM | POA: Diagnosis not present

## 2020-12-21 DIAGNOSIS — H2513 Age-related nuclear cataract, bilateral: Secondary | ICD-10-CM | POA: Diagnosis not present

## 2020-12-21 DIAGNOSIS — H3581 Retinal edema: Secondary | ICD-10-CM | POA: Diagnosis not present

## 2020-12-22 DIAGNOSIS — H35371 Puckering of macula, right eye: Secondary | ICD-10-CM | POA: Diagnosis not present

## 2020-12-22 DIAGNOSIS — H52203 Unspecified astigmatism, bilateral: Secondary | ICD-10-CM | POA: Diagnosis not present

## 2020-12-22 DIAGNOSIS — H2513 Age-related nuclear cataract, bilateral: Secondary | ICD-10-CM | POA: Diagnosis not present

## 2020-12-27 DIAGNOSIS — G4733 Obstructive sleep apnea (adult) (pediatric): Secondary | ICD-10-CM | POA: Diagnosis not present

## 2020-12-28 DIAGNOSIS — M25562 Pain in left knee: Secondary | ICD-10-CM | POA: Diagnosis not present

## 2020-12-28 DIAGNOSIS — M25561 Pain in right knee: Secondary | ICD-10-CM | POA: Diagnosis not present

## 2021-01-26 DIAGNOSIS — H25811 Combined forms of age-related cataract, right eye: Secondary | ICD-10-CM | POA: Diagnosis not present

## 2021-01-26 DIAGNOSIS — H2511 Age-related nuclear cataract, right eye: Secondary | ICD-10-CM | POA: Diagnosis not present

## 2021-01-30 NOTE — Progress Notes (Signed)
HPI F never smoker folowed for OSA,Insomnia, complicated by Hyperlipidemia, Obesity, GERD CPAP 10/ [facility] HST 05/28/16- AHI 16.7/ hr, desaturation to 78%, body weight 230 lbs  ========================================================  01/29/20- 69 yoF never smoker folowed for OSA,Insomnia, complicated by Hyperlipidemia, Obesity, GERD CPAP 10/ [facility] Download- compliance 83%, AHI 2.5/ hr Body weight today- 232 lbs Covid vax- 3 Phizer Flu vax-had -----Patient is not sleeping well lately and feels like it is hard to swallow when she is laying down and sometimes chokes.  Reviewed download. CPAP ok, but for 6-12 months is waking during night with difficulty getting back to sleep. Gets up and reads. Drinks some coffee as late as suppertime- discussed.  Occ swallows wrong, chokes easily. At night lying on R side, is apt to feel unable to swallow. Discussed potential need for MBS/ Speech Therapy, or GI.  01/31/21- 70 yoF never smoker folowed for OSA,Insomnia, complicated by Hyperlipidemia, Obesity, GERD CPAP 10/ [facility] Download- compliance 93%, AHI 1.2/ hr Body weight today-  Covid vax- 5 Phizer Flu vax-had ------Patient states that when she wakes up in the morning she has a headache. Has been going on for last 4-5 months. Goes away after being awake for awhile.  Final waking in the morning is usually associated with the need for bathroom.  As she sits up she becomes aware of bitemporal headache which lasts about 15 minutes without treatment.  No associated numbness, dizziness, visual changes or other complications but she finds it annoying.  She has not recognized if there is a connection to CPAP, diabetes medications or some other factor.  She has a long history of headaches of different patterns.  She did wonder if use of a So clean machine over about the same interval could have any bearing.  She had explored an oral appliance with Dr. Toni Arthurs in the past but was told she was not a candidate.  She  finds CPAP hose and mask disturbing and is interested in seeing Dr. Myrtis Ser for another opinion about oral appliance therapy.  ROS-see HPI   + = positive Constitutional:    weight loss, night sweats, fevers, chills, fatigue, lassitude. HEENT:    +headaches, +difficulty swallowing,+ tooth/dental problems, sore throat,       sneezing, itching, ear ache, nasal congestion, post nasal drip, snoring CV:    chest pain, orthopnea, PND, swelling in lower extremities, anasarca,                                   dizziness, palpitations Resp:   shortness of breath with exertion or at rest.                productive cough,   non-productive cough, coughing up of blood.              change in color of mucus.  wheezing.   Skin:    rash or lesions. GI:  No-   heartburn, indigestion, abdominal pain, nausea, vomiting, diarrhea,                 change in bowel habits, loss of appetite GU: dysuria, change in color of urine, no urgency or frequency.   flank pain. MS:   joint pain, stiffness, decreased range of motion, back pain. Neuro-     nothing unusual Psych:  change in mood or affect.  +depression or anxiety.   memory loss.  OBJ- Physical Exam General- Alert, Oriented, Affect-appropriate, Distress-  none acute, + obese Skin- rash-none, lesions- none, excoriation- none Lymphadenopathy- none Head- atraumatic            Eyes- Gross vision intact, PERRLA, conjunctivae and secretions clear            Ears- Hearing, canals-normal            Nose- Clear, no-Septal dev, mucus, polyps, erosion, perforation             Throat- Mallampati II , mucosa clear , drainage- none, tonsils- atrophic, + teeth Neck- flexible , trachea midline, no stridor , thyroid nl, carotid no bruit Chest - symmetrical excursion , unlabored           Heart/CV- RRR , no murmur , no gallop  , no rub, nl s1 s2                           - JVD- none , edema- none, stasis changes- none, varices- none           Lung- clear to P&A, wheeze- none,  cough- none , dullness-none, rub- none           Chest wall-  Abd-  Br/ Gen/ Rectal- Not done, not indicated Extrem- cyanosis- none, clubbing, none, atrophy- none, strength- nl Neuro- grossly intact to observation

## 2021-01-31 ENCOUNTER — Encounter: Payer: Self-pay | Admitting: Internal Medicine

## 2021-01-31 ENCOUNTER — Other Ambulatory Visit: Payer: Self-pay

## 2021-01-31 ENCOUNTER — Ambulatory Visit: Payer: PPO | Admitting: Internal Medicine

## 2021-01-31 VITALS — BP 112/60 | HR 71 | Temp 97.9°F | Ht 64.0 in | Wt 215.0 lb

## 2021-01-31 DIAGNOSIS — R519 Headache, unspecified: Secondary | ICD-10-CM | POA: Diagnosis not present

## 2021-01-31 DIAGNOSIS — G4733 Obstructive sleep apnea (adult) (pediatric): Secondary | ICD-10-CM

## 2021-01-31 NOTE — Assessment & Plan Note (Signed)
She does not notice these bitemporal headaches if she wakes during the night but they appear pretty consistently if she sits up in the morning to get out of bed. Plan-she will discuss further with her primary physician.  Also try elevating head of bed a little.  Try off of the so clean machine since she asked about this.  We are referring to Dr. Myrtis Ser for consideration  of oral appliance alternative to CPAP.

## 2021-01-31 NOTE — Patient Instructions (Signed)
Order- referral to Dr Althea Grimmer, DDS  Orthodontist    consider oral appliance for OSA  Order- DME [facility]- please reduce CPAP pressure to 9 cwp  Try skipping So-clean for a week- see if that affects headache  Try your mouth guard  Try sleeping with your head a little higher

## 2021-01-31 NOTE — Assessment & Plan Note (Signed)
Benefits numerically from CPAP with excellent compliance and control.  Because of her headaches she is going to try for a week off of her so clean machine and she agrees to explore oral appliance therapy with Dr. Myrtis Ser.

## 2021-02-01 DIAGNOSIS — I10 Essential (primary) hypertension: Secondary | ICD-10-CM | POA: Diagnosis not present

## 2021-02-01 DIAGNOSIS — E1165 Type 2 diabetes mellitus with hyperglycemia: Secondary | ICD-10-CM | POA: Diagnosis not present

## 2021-02-01 DIAGNOSIS — D519 Vitamin B12 deficiency anemia, unspecified: Secondary | ICD-10-CM | POA: Diagnosis not present

## 2021-02-03 DIAGNOSIS — R7303 Prediabetes: Secondary | ICD-10-CM | POA: Diagnosis not present

## 2021-02-03 DIAGNOSIS — E538 Deficiency of other specified B group vitamins: Secondary | ICD-10-CM | POA: Diagnosis not present

## 2021-02-25 DIAGNOSIS — Z20822 Contact with and (suspected) exposure to covid-19: Secondary | ICD-10-CM | POA: Diagnosis not present

## 2021-02-25 DIAGNOSIS — J111 Influenza due to unidentified influenza virus with other respiratory manifestations: Secondary | ICD-10-CM | POA: Diagnosis not present

## 2021-03-16 DIAGNOSIS — H2512 Age-related nuclear cataract, left eye: Secondary | ICD-10-CM | POA: Diagnosis not present

## 2021-03-16 DIAGNOSIS — H25812 Combined forms of age-related cataract, left eye: Secondary | ICD-10-CM | POA: Diagnosis not present

## 2021-03-20 DIAGNOSIS — M170 Bilateral primary osteoarthritis of knee: Secondary | ICD-10-CM | POA: Diagnosis not present

## 2021-03-20 DIAGNOSIS — F331 Major depressive disorder, recurrent, moderate: Secondary | ICD-10-CM | POA: Diagnosis not present

## 2021-03-20 DIAGNOSIS — F411 Generalized anxiety disorder: Secondary | ICD-10-CM | POA: Diagnosis not present

## 2021-03-20 DIAGNOSIS — G4700 Insomnia, unspecified: Secondary | ICD-10-CM | POA: Diagnosis not present

## 2021-03-28 ENCOUNTER — Other Ambulatory Visit (HOSPITAL_COMMUNITY): Payer: Self-pay

## 2021-03-28 DIAGNOSIS — Z23 Encounter for immunization: Secondary | ICD-10-CM | POA: Diagnosis not present

## 2021-03-28 DIAGNOSIS — Z7185 Encounter for immunization safety counseling: Secondary | ICD-10-CM | POA: Diagnosis not present

## 2021-03-28 DIAGNOSIS — H6122 Impacted cerumen, left ear: Secondary | ICD-10-CM | POA: Diagnosis not present

## 2021-03-28 DIAGNOSIS — E1165 Type 2 diabetes mellitus with hyperglycemia: Secondary | ICD-10-CM | POA: Diagnosis not present

## 2021-03-28 DIAGNOSIS — H6121 Impacted cerumen, right ear: Secondary | ICD-10-CM | POA: Diagnosis not present

## 2021-03-28 DIAGNOSIS — H6123 Impacted cerumen, bilateral: Secondary | ICD-10-CM | POA: Diagnosis not present

## 2021-03-28 MED ORDER — OZEMPIC (0.25 OR 0.5 MG/DOSE) 2 MG/1.5ML ~~LOC~~ SOPN
0.5000 mg | PEN_INJECTOR | SUBCUTANEOUS | 2 refills | Status: AC
  Filled 2021-03-28: qty 1.5, 28d supply, fill #0

## 2021-04-04 DIAGNOSIS — M170 Bilateral primary osteoarthritis of knee: Secondary | ICD-10-CM | POA: Diagnosis not present

## 2021-04-05 ENCOUNTER — Other Ambulatory Visit (HOSPITAL_COMMUNITY): Payer: Self-pay

## 2021-04-11 DIAGNOSIS — M170 Bilateral primary osteoarthritis of knee: Secondary | ICD-10-CM | POA: Diagnosis not present

## 2021-04-18 DIAGNOSIS — M170 Bilateral primary osteoarthritis of knee: Secondary | ICD-10-CM | POA: Diagnosis not present

## 2021-04-18 DIAGNOSIS — G4700 Insomnia, unspecified: Secondary | ICD-10-CM | POA: Diagnosis not present

## 2021-04-18 DIAGNOSIS — T753XXA Motion sickness, initial encounter: Secondary | ICD-10-CM | POA: Diagnosis not present

## 2021-04-26 DIAGNOSIS — G4733 Obstructive sleep apnea (adult) (pediatric): Secondary | ICD-10-CM | POA: Diagnosis not present

## 2021-05-04 DIAGNOSIS — H04123 Dry eye syndrome of bilateral lacrimal glands: Secondary | ICD-10-CM | POA: Diagnosis not present

## 2021-05-04 DIAGNOSIS — Z961 Presence of intraocular lens: Secondary | ICD-10-CM | POA: Diagnosis not present

## 2021-05-04 DIAGNOSIS — H5202 Hypermetropia, left eye: Secondary | ICD-10-CM | POA: Diagnosis not present

## 2021-05-04 DIAGNOSIS — H353132 Nonexudative age-related macular degeneration, bilateral, intermediate dry stage: Secondary | ICD-10-CM | POA: Diagnosis not present

## 2021-05-08 DIAGNOSIS — E1165 Type 2 diabetes mellitus with hyperglycemia: Secondary | ICD-10-CM | POA: Diagnosis not present

## 2021-05-16 DIAGNOSIS — M1990 Unspecified osteoarthritis, unspecified site: Secondary | ICD-10-CM | POA: Diagnosis not present

## 2021-05-16 DIAGNOSIS — R232 Flushing: Secondary | ICD-10-CM | POA: Diagnosis not present

## 2021-05-16 DIAGNOSIS — E1121 Type 2 diabetes mellitus with diabetic nephropathy: Secondary | ICD-10-CM | POA: Diagnosis not present

## 2021-05-16 DIAGNOSIS — R7303 Prediabetes: Secondary | ICD-10-CM | POA: Diagnosis not present

## 2021-05-16 DIAGNOSIS — E1165 Type 2 diabetes mellitus with hyperglycemia: Secondary | ICD-10-CM | POA: Diagnosis not present

## 2021-05-16 DIAGNOSIS — I10 Essential (primary) hypertension: Secondary | ICD-10-CM | POA: Diagnosis not present

## 2021-05-17 DIAGNOSIS — R42 Dizziness and giddiness: Secondary | ICD-10-CM | POA: Diagnosis not present

## 2021-05-17 DIAGNOSIS — M1712 Unilateral primary osteoarthritis, left knee: Secondary | ICD-10-CM | POA: Diagnosis not present

## 2021-05-17 DIAGNOSIS — M170 Bilateral primary osteoarthritis of knee: Secondary | ICD-10-CM | POA: Diagnosis not present

## 2021-05-17 DIAGNOSIS — J309 Allergic rhinitis, unspecified: Secondary | ICD-10-CM | POA: Diagnosis not present

## 2021-05-31 NOTE — Progress Notes (Signed)
?HPI ?F never smoker folowed for OSA,Insomnia, complicated by Hyperlipidemia, Obesity, GERD ?CPAP 10/ [facility] ?HST 05/28/16- AHI 16.7/ hr, desaturation to 78%, body weight 230 lbs ? ?======================================================== ? ? ?01/31/21- 70 yoF never smoker folowed for OSA,Insomnia, complicated by Hyperlipidemia, Obesity, GERD ?CPAP 10/ [facility] ?Download- compliance 93%, AHI 1.2/ hr ?Body weight today- 215 lbs ?Covid vax- 5 Phizer ?Flu vax-had ?------Patient states that when she wakes up in the morning she has a headache. Has been going on for last 4-5 months. Goes away after being awake for awhile.  ?Final waking in the morning is usually associated with the need for bathroom.  As she sits up she becomes aware of bitemporal headache which lasts about 15 minutes without treatment.  No associated numbness, dizziness, visual changes or other complications but she finds it annoying.  She has not recognized if there is a connection to CPAP, diabetes medications or some other factor.  She has a long history of headaches of different patterns.  She did wonder if use of a So clean machine over about the same interval could have any bearing.  She had explored an oral appliance with Dr. Toni Arthurs in the past but was told she was not a candidate.  She finds CPAP hose and mask disturbing and is interested in seeing Dr. Myrtis Ser for another opinion about oral appliance therapy. ? ?06/01/21- 71 yoF never smoker folowed for OSA,Insomnia, complicated by Hyperlipidemia, Obesity, GERD, Depression,  ?CPAP 9/ [facility] ?Download- compliance 57%, AHI 1.7%      mostly short nights ?Body weight today- 208 lbs ?Covid vax- 4 Phizer ?Flu vax-had ?She had been turned down by Dr Toni Arthurs, but was going to Dr Myrtis Ser for second opinion about OAP. ?She decided not to see Dr. Myrtis Ser.  She has a history of TMJ surgery and tooth repair and she had found that her bruxism guard pulled her crowns off so she did not want to do anymore. ?Sleep is affected by  somatic discomforts.  She will get up sometimes and ice sore joints. ?Allergic rhinitis is bothering her and I suggested she try sedating Benadryl at night instead of a nonsedating antihistamine.  Another option for waking after sleep onset would be a short half-life sleep medicine like Sonata or Halcion which we discussed. ? ?ROS-see HPI   + = positive ?Constitutional:    weight loss, night sweats, fevers, chills, fatigue, lassitude. ?HEENT:    +headaches, +difficulty swallowing,+ tooth/dental problems, sore throat,  ?     sneezing, itching, ear ache, +nasal congestion, post nasal drip, snoring ?CV:    chest pain, orthopnea, PND, swelling in lower extremities, anasarca,                                   ?dizziness, palpitations ?Resp:   shortness of breath with exertion or at rest.   ?             productive cough,   non-productive cough, coughing up of blood.   ?           change in color of mucus.  wheezing.   ?Skin:    rash or lesions. ?GI:  No-   heartburn, indigestion, abdominal pain, nausea, vomiting, diarrhea,  ?               change in bowel habits, loss of appetite ?GU: dysuria, change in color of urine, no urgency or frequency.   flank  pain. ?MS:   joint pain, stiffness, decreased range of motion, back pain. ?Neuro-     nothing unusual ?Psych:  change in mood or affect.  +depression or anxiety.   memory loss. ? ?OBJ- Physical Exam ?General- Alert, Oriented, Affect-appropriate, Distress- none acute, + obese ?Skin- rash-none, lesions- none, excoriation- none ?Lymphadenopathy- none ?Head- atraumatic ?           Eyes- Gross vision intact, PERRLA, conjunctivae and secretions clear ?           Ears- Hearing, canals-normal ?           Nose- Clear, no-Septal dev, mucus, polyps, erosion, perforation  ?           Throat- Mallampati II , mucosa clear , drainage- none, tonsils- atrophic, + teeth ?Neck- flexible , trachea midline, no stridor , thyroid nl, carotid no bruit ?Chest - symmetrical excursion , unlabored ?           Heart/CV- RRR , no murmur , no gallop  , no rub, nl s1 s2 ?                          - JVD- none , edema- none, stasis changes- none, varices- none ?          Lung- clear to P&A, wheeze- none, cough- none , dullness-none, rub- none ?          Chest wall-  ?Abd-  ?Br/ Gen/ Rectal- Not done, not indicated ?Extrem- cyanosis- none, clubbing, none, atrophy- none, strength- nl ?Neuro- grossly intact to observation ? ? ?

## 2021-06-01 ENCOUNTER — Encounter: Payer: Self-pay | Admitting: Internal Medicine

## 2021-06-01 ENCOUNTER — Ambulatory Visit: Payer: PPO | Admitting: Internal Medicine

## 2021-06-01 DIAGNOSIS — F5101 Primary insomnia: Secondary | ICD-10-CM | POA: Diagnosis not present

## 2021-06-01 DIAGNOSIS — G4733 Obstructive sleep apnea (adult) (pediatric): Secondary | ICD-10-CM | POA: Diagnosis not present

## 2021-06-01 NOTE — Assessment & Plan Note (Signed)
Benefits from CPAP when she can use it but sleeping disturbed by allergic rhinitis and somatic discomforts. ?Plan-continue 9 CWP with effort to increase usage. ?

## 2021-06-01 NOTE — Assessment & Plan Note (Signed)
Multiple causes of sleep disturbance. ?Plan-try using sedating antihistamine Benadryl at bedtime to address allergic rhinitis as well as insomnia.  If this does not work well we might try [employer]. ?

## 2021-06-01 NOTE — Patient Instructions (Signed)
Try to use your CPAP more if you can. ? ?Consider trying otc diphenhydramine(Benadryl) 25-50 mg a little before bedtime to help allergies and insomnia. If that doesn't work for you, we do have short-half-life sleep meds that we can try. ? ?Please call if we can help ?

## 2021-07-18 DIAGNOSIS — M170 Bilateral primary osteoarthritis of knee: Secondary | ICD-10-CM | POA: Diagnosis not present

## 2021-07-18 DIAGNOSIS — I10 Essential (primary) hypertension: Secondary | ICD-10-CM | POA: Diagnosis not present

## 2021-07-20 DIAGNOSIS — Z23 Encounter for immunization: Secondary | ICD-10-CM | POA: Diagnosis not present

## 2021-07-27 DIAGNOSIS — G4733 Obstructive sleep apnea (adult) (pediatric): Secondary | ICD-10-CM | POA: Diagnosis not present

## 2021-08-01 DIAGNOSIS — I10 Essential (primary) hypertension: Secondary | ICD-10-CM | POA: Diagnosis not present

## 2021-08-01 DIAGNOSIS — E538 Deficiency of other specified B group vitamins: Secondary | ICD-10-CM | POA: Diagnosis not present

## 2021-08-01 DIAGNOSIS — E559 Vitamin D deficiency, unspecified: Secondary | ICD-10-CM | POA: Diagnosis not present

## 2021-08-01 DIAGNOSIS — E782 Mixed hyperlipidemia: Secondary | ICD-10-CM | POA: Diagnosis not present

## 2021-08-01 DIAGNOSIS — E1165 Type 2 diabetes mellitus with hyperglycemia: Secondary | ICD-10-CM | POA: Diagnosis not present

## 2021-08-03 DIAGNOSIS — Z789 Other specified health status: Secondary | ICD-10-CM | POA: Diagnosis not present

## 2021-08-03 DIAGNOSIS — I10 Essential (primary) hypertension: Secondary | ICD-10-CM | POA: Diagnosis not present

## 2021-08-03 DIAGNOSIS — E559 Vitamin D deficiency, unspecified: Secondary | ICD-10-CM | POA: Diagnosis not present

## 2021-08-03 DIAGNOSIS — R7303 Prediabetes: Secondary | ICD-10-CM | POA: Diagnosis not present

## 2021-08-03 DIAGNOSIS — E1165 Type 2 diabetes mellitus with hyperglycemia: Secondary | ICD-10-CM | POA: Diagnosis not present

## 2021-08-03 DIAGNOSIS — E782 Mixed hyperlipidemia: Secondary | ICD-10-CM | POA: Diagnosis not present

## 2021-08-03 DIAGNOSIS — G720 Drug-induced myopathy: Secondary | ICD-10-CM | POA: Diagnosis not present

## 2021-08-03 DIAGNOSIS — E538 Deficiency of other specified B group vitamins: Secondary | ICD-10-CM | POA: Diagnosis not present

## 2021-08-08 DIAGNOSIS — M170 Bilateral primary osteoarthritis of knee: Secondary | ICD-10-CM | POA: Diagnosis not present

## 2021-08-08 DIAGNOSIS — Z789 Other specified health status: Secondary | ICD-10-CM | POA: Diagnosis not present

## 2021-08-08 DIAGNOSIS — E782 Mixed hyperlipidemia: Secondary | ICD-10-CM | POA: Diagnosis not present

## 2021-08-08 DIAGNOSIS — G720 Drug-induced myopathy: Secondary | ICD-10-CM | POA: Diagnosis not present

## 2021-08-08 DIAGNOSIS — M25812 Other specified joint disorders, left shoulder: Secondary | ICD-10-CM | POA: Diagnosis not present

## 2021-09-15 DIAGNOSIS — E669 Obesity, unspecified: Secondary | ICD-10-CM | POA: Diagnosis not present

## 2021-09-15 DIAGNOSIS — R7303 Prediabetes: Secondary | ICD-10-CM | POA: Diagnosis not present

## 2021-10-17 DIAGNOSIS — Z23 Encounter for immunization: Secondary | ICD-10-CM | POA: Diagnosis not present

## 2021-10-17 DIAGNOSIS — M170 Bilateral primary osteoarthritis of knee: Secondary | ICD-10-CM | POA: Diagnosis not present

## 2021-10-24 DIAGNOSIS — M170 Bilateral primary osteoarthritis of knee: Secondary | ICD-10-CM | POA: Diagnosis not present

## 2021-10-24 DIAGNOSIS — E1165 Type 2 diabetes mellitus with hyperglycemia: Secondary | ICD-10-CM | POA: Diagnosis not present

## 2021-10-27 DIAGNOSIS — M170 Bilateral primary osteoarthritis of knee: Secondary | ICD-10-CM | POA: Diagnosis not present

## 2021-12-19 DIAGNOSIS — M170 Bilateral primary osteoarthritis of knee: Secondary | ICD-10-CM | POA: Diagnosis not present

## 2021-12-19 DIAGNOSIS — Z1339 Encounter for screening examination for other mental health and behavioral disorders: Secondary | ICD-10-CM | POA: Diagnosis not present

## 2021-12-19 DIAGNOSIS — Z0000 Encounter for general adult medical examination without abnormal findings: Secondary | ICD-10-CM | POA: Diagnosis not present

## 2021-12-19 DIAGNOSIS — E669 Obesity, unspecified: Secondary | ICD-10-CM | POA: Diagnosis not present

## 2021-12-19 DIAGNOSIS — Z6838 Body mass index (BMI) 38.0-38.9, adult: Secondary | ICD-10-CM | POA: Diagnosis not present

## 2021-12-19 DIAGNOSIS — Z1331 Encounter for screening for depression: Secondary | ICD-10-CM | POA: Diagnosis not present

## 2021-12-20 ENCOUNTER — Other Ambulatory Visit: Payer: Self-pay | Admitting: Family Medicine

## 2021-12-20 DIAGNOSIS — Z23 Encounter for immunization: Secondary | ICD-10-CM | POA: Diagnosis not present

## 2021-12-20 DIAGNOSIS — Z1231 Encounter for screening mammogram for malignant neoplasm of breast: Secondary | ICD-10-CM

## 2021-12-20 DIAGNOSIS — M170 Bilateral primary osteoarthritis of knee: Secondary | ICD-10-CM | POA: Diagnosis not present

## 2021-12-22 DIAGNOSIS — F419 Anxiety disorder, unspecified: Secondary | ICD-10-CM | POA: Diagnosis not present

## 2021-12-22 DIAGNOSIS — G4700 Insomnia, unspecified: Secondary | ICD-10-CM | POA: Diagnosis not present

## 2021-12-22 DIAGNOSIS — E038 Other specified hypothyroidism: Secondary | ICD-10-CM | POA: Diagnosis not present

## 2021-12-22 DIAGNOSIS — E669 Obesity, unspecified: Secondary | ICD-10-CM | POA: Diagnosis not present

## 2021-12-22 DIAGNOSIS — G63 Polyneuropathy in diseases classified elsewhere: Secondary | ICD-10-CM | POA: Diagnosis not present

## 2021-12-22 DIAGNOSIS — G43909 Migraine, unspecified, not intractable, without status migrainosus: Secondary | ICD-10-CM | POA: Diagnosis not present

## 2021-12-22 DIAGNOSIS — E039 Hypothyroidism, unspecified: Secondary | ICD-10-CM | POA: Diagnosis not present

## 2021-12-22 DIAGNOSIS — E785 Hyperlipidemia, unspecified: Secondary | ICD-10-CM | POA: Diagnosis not present

## 2021-12-22 DIAGNOSIS — F3342 Major depressive disorder, recurrent, in full remission: Secondary | ICD-10-CM | POA: Diagnosis not present

## 2021-12-22 DIAGNOSIS — E1142 Type 2 diabetes mellitus with diabetic polyneuropathy: Secondary | ICD-10-CM | POA: Diagnosis not present

## 2021-12-22 DIAGNOSIS — E559 Vitamin D deficiency, unspecified: Secondary | ICD-10-CM | POA: Diagnosis not present

## 2021-12-22 DIAGNOSIS — E1169 Type 2 diabetes mellitus with other specified complication: Secondary | ICD-10-CM | POA: Diagnosis not present

## 2021-12-26 ENCOUNTER — Encounter: Payer: Self-pay | Admitting: Orthopaedic Surgery

## 2021-12-26 ENCOUNTER — Ambulatory Visit: Payer: Self-pay

## 2021-12-26 ENCOUNTER — Ambulatory Visit (INDEPENDENT_AMBULATORY_CARE_PROVIDER_SITE_OTHER): Payer: PPO | Admitting: Orthopaedic Surgery

## 2021-12-26 ENCOUNTER — Ambulatory Visit (INDEPENDENT_AMBULATORY_CARE_PROVIDER_SITE_OTHER): Payer: PPO

## 2021-12-26 DIAGNOSIS — M21161 Varus deformity, not elsewhere classified, right knee: Secondary | ICD-10-CM

## 2021-12-26 DIAGNOSIS — G8929 Other chronic pain: Secondary | ICD-10-CM

## 2021-12-26 DIAGNOSIS — M25561 Pain in right knee: Secondary | ICD-10-CM

## 2021-12-26 DIAGNOSIS — M1711 Unilateral primary osteoarthritis, right knee: Secondary | ICD-10-CM

## 2021-12-26 DIAGNOSIS — M25562 Pain in left knee: Secondary | ICD-10-CM | POA: Diagnosis not present

## 2021-12-26 DIAGNOSIS — M1712 Unilateral primary osteoarthritis, left knee: Secondary | ICD-10-CM | POA: Diagnosis not present

## 2021-12-26 NOTE — Progress Notes (Signed)
Office Visit Note   Patient: Kimberly Murray           Date of Birth: 1950/03/15           MRN: [id number] Visit Date: 12/26/2021              Requested by: Fanny Bien, MD [address] [place],  [place] [address] PCP: Fanny Bien, MD   Assessment & Plan: Visit Diagnoses:  1. Primary osteoarthritis of left knee   2. Primary osteoarthritis of right knee     Plan: Impression is bilateral severe tricompartmental osteoarthritis that is bone-on-bone.  Symptomatically speaking this is worse than the left knee.  Treatment options were again explained in detail and based on her options she has elected to move forward with a left knee replacement in the near future.  Will delay surgery for at least 90 days due to recent cortisone injection.  Patient denies nickel allergy or history of DVT.  Follow-Up Instructions: No follow-ups on file.   Orders:  Orders Placed This Encounter  Procedures   XR KNEE 3 VIEW LEFT   XR KNEE 3 VIEW RIGHT   No orders of the defined types were placed in this encounter.     Procedures: No procedures performed   Clinical Data: No additional findings.   Subjective: Chief Complaint  Patient presents with   Right Knee - Pain   Left Knee - Pain    HPI Kimberly Murray is a very pleasant 71 year old female comes in to see me about bilateral knee pain worse on the left.  The pain has gotten worse over the last 3 years.  It is significantly interfering with quality of life and ADLs.  She has had cortisone and Visco supplementation injections in the past without significant relief.  She just had bilateral cortisone injections last week and has experienced only partial relief.  This was done at Dr. Ival Bible office who is her PCP.  Review of Systems  Constitutional: Negative.   HENT: Negative.    Eyes: Negative.   Respiratory: Negative.    Cardiovascular: Negative.   Endocrine: Negative.   Musculoskeletal: Negative.   Neurological: Negative.    Hematological: Negative.   Psychiatric/Behavioral: Negative.    All other systems reviewed and are negative.    Objective: Vital Signs: There were no vitals taken for this visit.  Physical Exam Vitals and nursing note reviewed.  Constitutional:      Appearance: She is well-developed.  HENT:     Head: Atraumatic.     Nose: Nose normal.  Eyes:     Extraocular Movements: Extraocular movements intact.  Cardiovascular:     Pulses: Normal pulses.  Pulmonary:     Effort: Pulmonary effort is normal.  Abdominal:     Palpations: Abdomen is soft.  Musculoskeletal:     Cervical back: Neck supple.  Skin:    General: Skin is warm.     Capillary Refill: Capillary refill takes less than 2 seconds.  Neurological:     Mental Status: She is alert. Mental status is at baseline.  Psychiatric:        Behavior: Behavior normal.        Thought Content: Thought content normal.        Judgment: Judgment normal.     Ortho Exam Examination of bilateral knees show joint line tenderness.  1+ patellofemoral crepitus with range of motion which is painful.  Collaterals and cruciates are stable.  Trace effusions.  Pain with flexion of  the knees past 90 degrees. Specialty Comments:  No specialty comments available.  Imaging: XR KNEE 3 VIEW RIGHT  Result Date: 12/26/2021 Advanced tricompartmental degenerative joint disease.  Bone-on-bone joint space narrowing.  Varus deformity.  XR KNEE 3 VIEW LEFT  Result Date: 12/26/2021 Advanced tricompartmental degenerative joint disease.  Bone-on-bone joint space narrowing.  Varus deformity.    PMFS History: Patient Active Problem List   Diagnosis Date Noted   Headache 01/28/2019   Obstructive sleep apnea 08/01/2018   Insomnia 08/01/2018   GERD (gastroesophageal reflux disease) 08/01/2018   Past Medical History:  Diagnosis Date   Allergic rhinitis    Chronic headaches    Hyperlipidemia    Hypertension    Sleep apnea     No family history on  file.  Past Surgical History:  Procedure Laterality Date   CHOLECYSTECTOMY  2006   LAPAROSCOPIC HYSTERECTOMY  2005   Social History   Occupational History   Not on file  Tobacco Use   Smoking status: Never   Smokeless tobacco: Never  Vaping Use   Vaping Use: Never used  Substance and Sexual Activity   Alcohol use: Not Currently   Drug use: Never   Sexual activity: Not Currently

## 2022-02-05 DIAGNOSIS — R7303 Prediabetes: Secondary | ICD-10-CM | POA: Diagnosis not present

## 2022-02-05 DIAGNOSIS — I10 Essential (primary) hypertension: Secondary | ICD-10-CM | POA: Diagnosis not present

## 2022-02-05 DIAGNOSIS — E538 Deficiency of other specified B group vitamins: Secondary | ICD-10-CM | POA: Diagnosis not present

## 2022-02-07 DIAGNOSIS — R7303 Prediabetes: Secondary | ICD-10-CM | POA: Diagnosis not present

## 2022-02-07 DIAGNOSIS — F32A Depression, unspecified: Secondary | ICD-10-CM | POA: Diagnosis not present

## 2022-02-07 DIAGNOSIS — E538 Deficiency of other specified B group vitamins: Secondary | ICD-10-CM | POA: Diagnosis not present

## 2022-02-07 DIAGNOSIS — F419 Anxiety disorder, unspecified: Secondary | ICD-10-CM | POA: Diagnosis not present

## 2022-02-07 DIAGNOSIS — I10 Essential (primary) hypertension: Secondary | ICD-10-CM | POA: Diagnosis not present

## 2022-02-07 DIAGNOSIS — E669 Obesity, unspecified: Secondary | ICD-10-CM | POA: Diagnosis not present

## 2022-03-01 ENCOUNTER — Ambulatory Visit
Admission: RE | Admit: 2022-03-01 | Discharge: 2022-03-01 | Disposition: A | Discharge status: Home / Self Care | Payer: PPO | Source: Ambulatory Visit | Attending: Family Medicine | Admitting: Family Medicine

## 2022-03-01 DIAGNOSIS — Z1231 Encounter for screening mammogram for malignant neoplasm of breast: Secondary | ICD-10-CM | POA: Diagnosis not present

## 2022-03-05 ENCOUNTER — Other Ambulatory Visit: Payer: Self-pay

## 2022-03-07 ENCOUNTER — Encounter: Payer: Self-pay | Admitting: Orthopaedic Surgery

## 2022-03-12 ENCOUNTER — Other Ambulatory Visit: Payer: Self-pay | Admitting: Physician Assistant

## 2022-03-12 MED ORDER — METHOCARBAMOL 750 MG PO TABS: 750.0000 mg | ORAL_TABLET | Freq: Two times a day (BID) | ORAL | 2 refills | Status: AC | PRN

## 2022-03-12 MED ORDER — ASPIRIN 81 MG PO TBEC: 81.0000 mg | DELAYED_RELEASE_TABLET | Freq: Two times a day (BID) | ORAL | 0 refills | Status: AC

## 2022-03-12 MED ORDER — OXYCODONE-ACETAMINOPHEN 5-325 MG PO TABS: 1.0000 | ORAL_TABLET | Freq: Four times a day (QID) | ORAL | 0 refills | Status: DC | PRN

## 2022-03-12 MED ORDER — DOCUSATE SODIUM 100 MG PO CAPS: 100.0000 mg | ORAL_CAPSULE | Freq: Every day | ORAL | 2 refills | Status: AC | PRN

## 2022-03-12 MED ORDER — ONDANSETRON HCL 4 MG PO TABS: 4.0000 mg | ORAL_TABLET | Freq: Three times a day (TID) | ORAL | 0 refills | Status: AC | PRN

## 2022-03-13 NOTE — Pre-Procedure Instructions (Addendum)
Surgical Instructions     Your procedure is scheduled on Monday, January 29.  Report to [facility] Main Entrance "A" at 8:30 A.M., then check in with the Admitting office.  Call this number if you have problems the morning of surgery:  [phone]   If you have any questions prior to your surgery date call [phone]: Open Monday-Friday 8am-4pm If you experience any cold or flu symptoms such as cough, fever, chills, shortness of breath, etc. between now and your scheduled surgery, please notify us at the above number     Remember:  Do not eat after midnight the night before your surgery  You may drink clear liquids until 7:30AM the morning of your surgery.   Clear liquids allowed are: Water, Non-Citrus Juices (without pulp), Carbonated Beverages, Clear Tea, Black Coffee ONLY (NO MILK, CREAM OR POWDERED CREAMER of any kind), and Gatorade    Take these medicines the morning of surgery with A SIP OF WATER:  loratadine (CLARITIN)  montelukast (SINGULAIR)  venlafaxine XR (EFFEXOR-XR)  ondansetron (ZOFRAN) if needed acetaminophen (TYLENOL)  if needed azelastine (ASTELIN) 0.1 % nasal spray  if needed methocarbamol (ROBAXIN-750)  if needed   As of today, STOP taking any Aspirin (unless otherwise instructed by your surgeon) Aleve, Naproxen, Ibuprofen, Motrin, Advil, Goody's, BC's, all herbal medications, fish oil, and all vitamins.  DO NOT TAKE Semaglutide (Ozempic) for 7 days prior to surgery.           Do not wear jewelry or makeup. Do not wear lotions, powders, perfumes/cologne or deodorant. Do not shave 48 hours prior to surgery.  Men may shave face and neck. Do not bring valuables to the hospital. Do not wear nail polish, gel polish, artificial nails, or any other type of covering on natural nails (fingers and toes) If you have artificial nails or gel coating that need to be removed by a nail salon, please have this removed prior to surgery. Artificial nails or gel coating may  interfere with anesthesia's ability to adequately monitor your vital signs.  [facility] is not responsible for any belongings or valuables.    Do NOT Smoke (Tobacco/Vaping)  24 hours prior to your procedure  If you use a CPAP at night, you may bring your mask for your overnight stay.   Contacts, glasses, hearing aids, dentures or partials may not be worn into surgery, please bring cases for these belongings   For patients admitted to the hospital, discharge time will be determined by your treatment team.   Patients discharged the day of surgery will not be allowed to drive home, and someone needs to stay with them for 24 hours.   SURGICAL WAITING ROOM VISITATION Patients having surgery or a procedure may have no more than 2 support people in the waiting area - these visitors may rotate.   Children under the age of 51 must have an adult with them who is not the patient. If the patient needs to stay at the hospital during part of their recovery, the visitor guidelines for inpatient rooms apply. Pre-op nurse will coordinate an appropriate time for 1 support person to accompany patient in pre-op.  This support person may not rotate.   Please refer to [email or web address] for the visitor guidelines for Inpatients (after your surgery is over and you are in a regular room).    Special instructions:    Oral Hygiene is also important to reduce your risk of infection.  Remember - BRUSH YOUR TEETH THE MORNING OF SURGERY  WITH YOUR REGULAR TOOTHPASTE   [place]- Preparing For Surgery  Before surgery, you can play an important role. Because skin is not sterile, your skin needs to be as free of germs as possible. You can reduce the number of germs on your skin by washing with CHG (chlorahexidine gluconate) Soap before surgery.  CHG is an antiseptic cleaner which kills germs and bonds with the skin to continue killing germs even after washing.      Please do not use if you have an allergy to CHG or antibacterial soaps. If your skin becomes reddened/irritated stop using the CHG.  Do not shave (including legs and underarms) for at least 48 hours prior to first CHG shower. It is OK to shave your face.  Please follow these instructions carefully.     Shower the NIGHT BEFORE SURGERY and the MORNING OF SURGERY with CHG Soap.   If you chose to wash your hair, wash your hair first as usual with your normal shampoo. After you shampoo, rinse your hair and body thoroughly to remove the shampoo.  Then [employer] and genitals (private parts) with your normal soap and rinse thoroughly to remove soap.  After that Use CHG Soap as you would any other liquid soap. You can apply CHG directly to the skin and wash gently with a scrungie or a clean washcloth.   Apply the CHG Soap to your body ONLY FROM THE NECK DOWN.  Do not use on open wounds or open sores. Avoid contact with your eyes, ears, mouth and genitals (private parts). Wash Face and genitals (private parts)  with your normal soap.   Wash thoroughly, paying special attention to the area where your surgery will be performed.  Thoroughly rinse your body with warm water from the neck down.  DO NOT shower/wash with your normal soap after using and rinsing off the CHG Soap.  Pat yourself dry with a CLEAN TOWEL.  Wear CLEAN PAJAMAS to bed the night before surgery  Place CLEAN SHEETS on your bed the night before your surgery  DO NOT SLEEP WITH PETS.   Day of Surgery:  Take a shower with CHG soap. Wear Clean/Comfortable clothing the morning of surgery Do not apply any deodorants/lotions.   Remember to brush your teeth WITH YOUR REGULAR TOOTHPASTE.    If you received a COVID test during your pre-op visit, it is requested that you wear a mask when out in public, stay away from anyone that may not be feeling well, and notify your surgeon if you develop symptoms. If you have been in contact  with anyone that has tested positive in the last 10 days, please notify your surgeon.    Please read over the following fact sheets that you were given.

## 2022-03-14 ENCOUNTER — Encounter (HOSPITAL_COMMUNITY)
Admission: RE | Admit: 2022-03-14 | Discharge: 2022-03-14 | Disposition: A | Discharge status: Home / Self Care | Payer: PPO | Source: Ambulatory Visit | Attending: Orthopaedic Surgery | Admitting: Orthopaedic Surgery

## 2022-03-14 ENCOUNTER — Encounter (HOSPITAL_COMMUNITY): Payer: Self-pay

## 2022-03-14 ENCOUNTER — Other Ambulatory Visit: Payer: Self-pay

## 2022-03-14 ENCOUNTER — Other Ambulatory Visit (HOSPITAL_COMMUNITY): Payer: PPO

## 2022-03-14 VITALS — BP 145/76 | HR 71 | Temp 98.1°F | Resp 17 | Ht 64.0 in | Wt 204.0 lb

## 2022-03-14 DIAGNOSIS — M1712 Unilateral primary osteoarthritis, left knee: Secondary | ICD-10-CM | POA: Insufficient documentation

## 2022-03-14 DIAGNOSIS — K760 Fatty (change of) liver, not elsewhere classified: Secondary | ICD-10-CM | POA: Diagnosis not present

## 2022-03-14 DIAGNOSIS — Z01818 Encounter for other preprocedural examination: Secondary | ICD-10-CM | POA: Insufficient documentation

## 2022-03-14 HISTORY — DX: Depression, unspecified: F32.A

## 2022-03-14 HISTORY — DX: Personal history of other diseases of the respiratory system: Z87.09

## 2022-03-14 HISTORY — DX: Other specified postprocedural states: Z98.890

## 2022-03-14 HISTORY — DX: Fatty (change of) liver, not elsewhere classified: K76.0

## 2022-03-14 HISTORY — DX: Prediabetes: R73.03

## 2022-03-14 HISTORY — DX: Polyneuropathy, unspecified: G62.9

## 2022-03-14 HISTORY — DX: Other complications of anesthesia, initial encounter: T88.59XA

## 2022-03-14 LAB — CBC
HCT: 46.9 % — ABNORMAL HIGH (ref 36.0–46.0)
Hemoglobin: 15.4 g/dL — ABNORMAL HIGH (ref 12.0–15.0)
MCH: 31.2 pg (ref 26.0–34.0)
MCHC: 32.8 g/dL (ref 30.0–36.0)
MCV: 95.1 fL (ref 80.0–100.0)
Platelets: 374 10*3/uL (ref 150–400)
RBC: 4.93 MIL/uL (ref 3.87–5.11)
RDW: 13 % (ref 11.5–15.5)
WBC: 6.4 10*3/uL (ref 4.0–10.5)
nRBC: 0 % (ref 0.0–0.2)

## 2022-03-14 LAB — SURGICAL PCR SCREEN
MRSA, PCR: NEGATIVE
Staphylococcus aureus: NEGATIVE

## 2022-03-14 NOTE — Pre-Procedure Instructions (Signed)
Patient Instructions  The night before surgery:  No food after midnight. ONLY clear liquids after midnight    The day of surgery (if you have diabetes): Drink ONE (1) 12 oz G2 given to you in your pre admission testing appointment by 8:00AM the morning of surgery. Drink in one sitting. Do not sip.  This drink was given to you during your hospital  pre-op appointment visit.  Nothing else to drink after completing the  12 oz bottle of G2.         If you have questions, please contact your surgeon's office.

## 2022-03-14 NOTE — Progress Notes (Signed)
PCP - Rachell Cipro Cardiologist - denies  PPM/ICD - denies  Chest x-ray - N/A EKG - 03/14/22 Stress Test - denies ECHO - denies Cardiac Cath - denies  Sleep Study - Sleep study 07/04/16, pt wears CPAP, does not know settings.    Fasting Blood Sugar - pt is pre-DM, does not check blood sugar at home.   Last dose of GLP1 agonist-  Pt does not take Ozempic any more   Blood Thinner Instructions: N/A Aspirin Instructions:N/A  ERAS Protcol - ERAS+ G2   COVID TEST- N/A   Anesthesia review no:   Patient denies shortness of breath, fever, cough and chest pain at PAT appointment   All instructions explained to the patient, with a verbal understanding of the material. Patient agrees to go over the instructions while at home for a better understanding. Patient also instructed to self quarantine after being tested for COVID-19. The opportunity to ask questions was provided.

## 2022-03-14 NOTE — Progress Notes (Signed)
Per lab, CMP sample hemolyzed. Will need to re-draw on DOS. Order placed.

## 2022-03-16 ENCOUNTER — Telehealth: Payer: Self-pay | Admitting: *Deleted

## 2022-03-16 NOTE — Telephone Encounter (Signed)
Ortho bundle pre-op call.

## 2022-03-16 NOTE — Care Plan (Signed)
[facility] RNCM call to patient to discuss her upcoming Left total knee arthroplasty with Dr. Erlinda Hong on Monday, 03/19/22. She is an Ortho bundle patient through [facility] and is agreeable to case management. She lives with her spouse, who will be able to assist at home after discharge. She has received her home CPM and RW through [place] prior to surgery. Anticipate HHPT will be needed after a short hospital stay. Choice provided and referral made to [facility]. Reviewed all post op care instructions. Will continue to follow for needs.

## 2022-03-19 ENCOUNTER — Encounter (HOSPITAL_COMMUNITY)
Admission: RE | Disposition: A | Discharge status: Home Health | Payer: Self-pay | Source: Home / Self Care | Attending: Orthopaedic Surgery

## 2022-03-19 ENCOUNTER — Other Ambulatory Visit: Payer: Self-pay

## 2022-03-19 ENCOUNTER — Ambulatory Visit (HOSPITAL_COMMUNITY): Payer: PPO | Admitting: Anesthesiology

## 2022-03-19 ENCOUNTER — Encounter (HOSPITAL_COMMUNITY): Payer: Self-pay | Admitting: Orthopaedic Surgery

## 2022-03-19 ENCOUNTER — Observation Stay (HOSPITAL_COMMUNITY): Payer: PPO

## 2022-03-19 ENCOUNTER — Observation Stay (HOSPITAL_COMMUNITY)
Admission: RE | Admit: 2022-03-19 | Discharge: 2022-03-20 | Disposition: A | Discharge status: Home Health | Payer: PPO | Attending: Orthopaedic Surgery | Admitting: Orthopaedic Surgery

## 2022-03-19 ENCOUNTER — Ambulatory Visit (HOSPITAL_BASED_OUTPATIENT_CLINIC_OR_DEPARTMENT_OTHER): Payer: PPO | Admitting: Anesthesiology

## 2022-03-19 DIAGNOSIS — I10 Essential (primary) hypertension: Secondary | ICD-10-CM | POA: Insufficient documentation

## 2022-03-19 DIAGNOSIS — Z7982 Long term (current) use of aspirin: Secondary | ICD-10-CM | POA: Diagnosis not present

## 2022-03-19 DIAGNOSIS — Z79899 Other long term (current) drug therapy: Secondary | ICD-10-CM | POA: Insufficient documentation

## 2022-03-19 DIAGNOSIS — Z96652 Presence of left artificial knee joint: Secondary | ICD-10-CM | POA: Diagnosis not present

## 2022-03-19 DIAGNOSIS — M1712 Unilateral primary osteoarthritis, left knee: Secondary | ICD-10-CM

## 2022-03-19 DIAGNOSIS — G8918 Other acute postprocedural pain: Secondary | ICD-10-CM | POA: Diagnosis not present

## 2022-03-19 DIAGNOSIS — Z471 Aftercare following joint replacement surgery: Secondary | ICD-10-CM | POA: Diagnosis not present

## 2022-03-19 DIAGNOSIS — K760 Fatty (change of) liver, not elsewhere classified: Secondary | ICD-10-CM

## 2022-03-19 HISTORY — PX: TOTAL KNEE ARTHROPLASTY: SHX125

## 2022-03-19 LAB — COMPREHENSIVE METABOLIC PANEL
ALT: 20 U/L (ref 0–44)
AST: 20 U/L (ref 15–41)
Albumin: 4 g/dL (ref 3.5–5.0)
Alkaline Phosphatase: 46 U/L (ref 38–126)
Anion gap: 10 (ref 5–15)
BUN: 23 mg/dL (ref 8–23)
CO2: 23 mmol/L (ref 22–32)
Calcium: 9.2 mg/dL (ref 8.9–10.3)
Chloride: 104 mmol/L (ref 98–111)
Creatinine, Ser: 0.76 mg/dL (ref 0.44–1.00)
GFR, Estimated: >60 mL/min (ref >60)
Glucose, Bld: 87 mg/dL (ref 70–99)
Potassium: 4.2 mmol/L (ref 3.5–5.1)
Sodium: 137 mmol/L (ref 135–145)
Total Bilirubin: 0.6 mg/dL (ref 0.3–1.2)
Total Protein: 6.5 g/dL (ref 6.5–8.1)

## 2022-03-19 SURGERY — ARTHROPLASTY, KNEE, TOTAL
Anesthesia: Spinal | Site: Knee | Laterality: Left

## 2022-03-19 MED ORDER — ONDANSETRON HCL 4 MG/2ML IJ SOLN: 4.0000 mg | Freq: Once | INTRAMUSCULAR | Status: DC | PRN

## 2022-03-19 MED ORDER — ACETAMINOPHEN 500 MG PO TABS
1000.0000 mg | ORAL_TABLET | Freq: Four times a day (QID) | ORAL | Status: AC
  Administered 2022-03-19 – 2022-03-20 (×4): 1000 mg via ORAL
  Filled 2022-03-19 (×3): qty 2

## 2022-03-19 MED ORDER — METHOCARBAMOL 1000 MG/10ML IJ SOLN: 500.0000 mg | Freq: Four times a day (QID) | INTRAVENOUS | Status: DC | PRN

## 2022-03-19 MED ORDER — OXYCODONE HCL 5 MG PO TABS: 10.0000 mg | ORAL_TABLET | ORAL | Status: DC | PRN

## 2022-03-19 MED ORDER — ORAL CARE MOUTH RINSE: 15.0000 mL | Freq: Once | OROMUCOSAL | Status: AC

## 2022-03-19 MED ORDER — METOCLOPRAMIDE HCL 5 MG/ML IJ SOLN: 5.0000 mg | Freq: Three times a day (TID) | INTRAMUSCULAR | Status: DC | PRN

## 2022-03-19 MED ORDER — 0.9 % SODIUM CHLORIDE (POUR BTL) OPTIME
TOPICAL | Status: DC | PRN
  Administered 2022-03-19: 1000 mL

## 2022-03-19 MED ORDER — FENTANYL CITRATE (PF) 100 MCG/2ML IJ SOLN: 25.0000 ug | INTRAMUSCULAR | Status: DC | PRN

## 2022-03-19 MED ORDER — FERROUS SULFATE 325 (65 FE) MG PO TABS
325.0000 mg | ORAL_TABLET | Freq: Three times a day (TID) | ORAL | Status: DC
  Administered 2022-03-19 – 2022-03-20 (×2): 325 mg via ORAL
  Filled 2022-03-19 (×2): qty 1

## 2022-03-19 MED ORDER — PRONTOSAN WOUND IRRIGATION OPTIME
TOPICAL | Status: DC | PRN
  Administered 2022-03-19: 1 via TOPICAL

## 2022-03-19 MED ORDER — FENTANYL CITRATE (PF) 100 MCG/2ML IJ SOLN: 50.0000 ug | Freq: Once | INTRAMUSCULAR | Status: AC

## 2022-03-19 MED ORDER — LOSARTAN POTASSIUM 50 MG PO TABS
50.0000 mg | ORAL_TABLET | Freq: Every day | ORAL | Status: DC
  Administered 2022-03-20: 50 mg via ORAL
  Filled 2022-03-19: qty 1

## 2022-03-19 MED ORDER — VANCOMYCIN HCL 1000 MG IV SOLR
INTRAVENOUS | Status: DC | PRN
  Administered 2022-03-19: 1000 mg via TOPICAL

## 2022-03-19 MED ORDER — DOCUSATE SODIUM 100 MG PO CAPS
100.0000 mg | ORAL_CAPSULE | Freq: Two times a day (BID) | ORAL | Status: DC
  Administered 2022-03-19 – 2022-03-20 (×2): 100 mg via ORAL
  Filled 2022-03-19 (×2): qty 1

## 2022-03-19 MED ORDER — ACETAMINOPHEN 325 MG PO TABS: 325.0000 mg | ORAL_TABLET | Freq: Four times a day (QID) | ORAL | Status: DC | PRN

## 2022-03-19 MED ORDER — ACETAMINOPHEN 500 MG PO TABS
ORAL_TABLET | ORAL | Status: AC
  Filled 2022-03-19: qty 2

## 2022-03-19 MED ORDER — LOSARTAN POTASSIUM-HCTZ 50-12.5 MG PO TABS: 1.0000 | ORAL_TABLET | Freq: Every morning | ORAL | Status: DC

## 2022-03-19 MED ORDER — STERILE WATER FOR IRRIGATION IR SOLN
Status: DC | PRN
  Administered 2022-03-19: 2000 mL

## 2022-03-19 MED ORDER — TRANEXAMIC ACID 1000 MG/10ML IV SOLN
2000.0000 mg | INTRAVENOUS | Status: DC
  Filled 2022-03-19: qty 20

## 2022-03-19 MED ORDER — SODIUM CHLORIDE 0.9 % IV SOLN: INTRAVENOUS | Status: DC

## 2022-03-19 MED ORDER — DEXAMETHASONE SODIUM PHOSPHATE 10 MG/ML IJ SOLN
INTRAMUSCULAR | Status: DC | PRN
  Administered 2022-03-19: 10 mg via INTRAVENOUS

## 2022-03-19 MED ORDER — POVIDONE-IODINE 10 % EX SWAB
2.0000 | Freq: Once | CUTANEOUS | Status: AC
  Administered 2022-03-19: 2 via TOPICAL

## 2022-03-19 MED ORDER — ACETAMINOPHEN 500 MG PO TABS
1000.0000 mg | ORAL_TABLET | Freq: Once | ORAL | Status: AC
  Administered 2022-03-19: 1000 mg via ORAL
  Filled 2022-03-19: qty 2

## 2022-03-19 MED ORDER — METHOCARBAMOL 500 MG PO TABS
ORAL_TABLET | ORAL | Status: AC
  Filled 2022-03-19: qty 1

## 2022-03-19 MED ORDER — ONDANSETRON HCL 4 MG/2ML IJ SOLN
INTRAMUSCULAR | Status: DC | PRN
  Administered 2022-03-19: 4 mg via INTRAVENOUS

## 2022-03-19 MED ORDER — METOCLOPRAMIDE HCL 5 MG PO TABS: 5.0000 mg | ORAL_TABLET | Freq: Three times a day (TID) | ORAL | Status: DC | PRN

## 2022-03-19 MED ORDER — METHOCARBAMOL 500 MG PO TABS
500.0000 mg | ORAL_TABLET | Freq: Four times a day (QID) | ORAL | Status: DC | PRN
  Administered 2022-03-19 – 2022-03-20 (×2): 500 mg via ORAL
  Filled 2022-03-19: qty 1

## 2022-03-19 MED ORDER — DEXAMETHASONE SODIUM PHOSPHATE 10 MG/ML IJ SOLN
10.0000 mg | Freq: Once | INTRAMUSCULAR | Status: AC
  Administered 2022-03-20: 10 mg via INTRAVENOUS
  Filled 2022-03-19: qty 1

## 2022-03-19 MED ORDER — KETOROLAC TROMETHAMINE 15 MG/ML IJ SOLN
7.5000 mg | Freq: Four times a day (QID) | INTRAMUSCULAR | Status: DC
  Administered 2022-03-19 – 2022-03-20 (×3): 7.5 mg via INTRAVENOUS
  Filled 2022-03-19 (×3): qty 1

## 2022-03-19 MED ORDER — ONDANSETRON HCL 4 MG/2ML IJ SOLN: 4.0000 mg | Freq: Four times a day (QID) | INTRAMUSCULAR | Status: DC | PRN

## 2022-03-19 MED ORDER — SODIUM CHLORIDE 0.9 % IR SOLN
Status: DC | PRN
  Administered 2022-03-19: 1000 mL

## 2022-03-19 MED ORDER — VENLAFAXINE HCL ER 150 MG PO CP24
150.0000 mg | ORAL_CAPSULE | Freq: Every morning | ORAL | Status: DC
  Administered 2022-03-20: 150 mg via ORAL
  Filled 2022-03-19: qty 1

## 2022-03-19 MED ORDER — ASPIRIN 81 MG PO CHEW
81.0000 mg | CHEWABLE_TABLET | Freq: Two times a day (BID) | ORAL | Status: DC
  Administered 2022-03-19 – 2022-03-20 (×2): 81 mg via ORAL
  Filled 2022-03-19 (×2): qty 1

## 2022-03-19 MED ORDER — BUPIVACAINE-MELOXICAM ER 400-12 MG/14ML IJ SOLN
INTRAMUSCULAR | Status: DC | PRN
  Administered 2022-03-19: 400 mg

## 2022-03-19 MED ORDER — LACTATED RINGERS IV SOLN: INTRAVENOUS | Status: DC

## 2022-03-19 MED ORDER — TRANEXAMIC ACID 1000 MG/10ML IV SOLN
INTRAVENOUS | Status: DC | PRN
  Administered 2022-03-19: 2000 mg via TOPICAL

## 2022-03-19 MED ORDER — MIDAZOLAM HCL 2 MG/2ML IJ SOLN: 2.0000 mg | Freq: Once | INTRAMUSCULAR | Status: AC

## 2022-03-19 MED ORDER — HYDROCHLOROTHIAZIDE 12.5 MG PO TABS
12.5000 mg | ORAL_TABLET | Freq: Every day | ORAL | Status: DC
  Administered 2022-03-20: 12.5 mg via ORAL
  Filled 2022-03-19: qty 1

## 2022-03-19 MED ORDER — OXYCODONE HCL ER 10 MG PO T12A
10.0000 mg | EXTENDED_RELEASE_TABLET | Freq: Two times a day (BID) | ORAL | Status: DC
  Administered 2022-03-19 – 2022-03-20 (×2): 10 mg via ORAL
  Filled 2022-03-19 (×2): qty 1

## 2022-03-19 MED ORDER — CEFAZOLIN SODIUM-DEXTROSE 2-4 GM/100ML-% IV SOLN
2.0000 g | Freq: Four times a day (QID) | INTRAVENOUS | Status: AC
  Administered 2022-03-19: 2 g via INTRAVENOUS
  Filled 2022-03-19: qty 100

## 2022-03-19 MED ORDER — TRANEXAMIC ACID-NACL 1000-0.7 MG/100ML-% IV SOLN
1000.0000 mg | Freq: Once | INTRAVENOUS | Status: AC
  Administered 2022-03-19: 1000 mg via INTRAVENOUS
  Filled 2022-03-19: qty 100

## 2022-03-19 MED ORDER — MENTHOL 3 MG MT LOZG: 1.0000 | LOZENGE | OROMUCOSAL | Status: DC | PRN

## 2022-03-19 MED ORDER — ONDANSETRON HCL 4 MG PO TABS
4.0000 mg | ORAL_TABLET | Freq: Four times a day (QID) | ORAL | Status: DC | PRN
  Administered 2022-03-19: 4 mg via ORAL
  Filled 2022-03-19: qty 1

## 2022-03-19 MED ORDER — ROPIVACAINE HCL 5 MG/ML IJ SOLN
INTRAMUSCULAR | Status: DC | PRN
  Administered 2022-03-19: 20 mL via PERINEURAL

## 2022-03-19 MED ORDER — CHLORHEXIDINE GLUCONATE 0.12 % MT SOLN
15.0000 mL | Freq: Once | OROMUCOSAL | Status: AC
  Administered 2022-03-19: 15 mL via OROMUCOSAL
  Filled 2022-03-19: qty 15

## 2022-03-19 MED ORDER — MIDAZOLAM HCL 2 MG/2ML IJ SOLN
INTRAMUSCULAR | Status: AC
  Administered 2022-03-19: 2 mg via INTRAVENOUS
  Filled 2022-03-19: qty 2

## 2022-03-19 MED ORDER — PROPOFOL 500 MG/50ML IV EMUL
INTRAVENOUS | Status: DC | PRN
  Administered 2022-03-19: 75 ug/kg/min via INTRAVENOUS

## 2022-03-19 MED ORDER — CEFAZOLIN SODIUM-DEXTROSE 2-4 GM/100ML-% IV SOLN
2.0000 g | INTRAVENOUS | Status: AC
  Administered 2022-03-19: 2 g via INTRAVENOUS
  Filled 2022-03-19: qty 100

## 2022-03-19 MED ORDER — SERTRALINE HCL 100 MG PO TABS
100.0000 mg | ORAL_TABLET | Freq: Every day | ORAL | Status: DC
  Administered 2022-03-19: 100 mg via ORAL
  Filled 2022-03-19: qty 1

## 2022-03-19 MED ORDER — PHENYLEPHRINE 80 MCG/ML (10ML) SYRINGE FOR IV PUSH (FOR BLOOD PRESSURE SUPPORT)
PREFILLED_SYRINGE | INTRAVENOUS | Status: DC | PRN
  Administered 2022-03-19: 80 ug via INTRAVENOUS

## 2022-03-19 MED ORDER — PHENOL 1.4 % MT LIQD: 1.0000 | OROMUCOSAL | Status: DC | PRN

## 2022-03-19 MED ORDER — HYDROMORPHONE HCL 1 MG/ML IJ SOLN: 0.5000 mg | INTRAMUSCULAR | Status: DC | PRN

## 2022-03-19 MED ORDER — PHENYLEPHRINE HCL-NACL 20-0.9 MG/250ML-% IV SOLN
INTRAVENOUS | Status: DC | PRN
  Administered 2022-03-19: 20 ug/min via INTRAVENOUS

## 2022-03-19 MED ORDER — TRANEXAMIC ACID-NACL 1000-0.7 MG/100ML-% IV SOLN
1000.0000 mg | INTRAVENOUS | Status: AC
  Administered 2022-03-19: 1000 mg via INTRAVENOUS
  Filled 2022-03-19: qty 100

## 2022-03-19 MED ORDER — FENTANYL CITRATE (PF) 100 MCG/2ML IJ SOLN
INTRAMUSCULAR | Status: AC
  Administered 2022-03-19: 50 ug via INTRAVENOUS
  Filled 2022-03-19: qty 2

## 2022-03-19 MED ORDER — OXYCODONE HCL 5 MG PO TABS
5.0000 mg | ORAL_TABLET | ORAL | Status: DC | PRN
  Administered 2022-03-20: 10 mg via ORAL
  Filled 2022-03-19: qty 2

## 2022-03-19 SURGICAL SUPPLY — 88 items
ALCOHOL 70% 16 OZ (MISCELLANEOUS) ×1 IMPLANT
BAG COUNTER SPONGE SURGICOUNT (BAG) IMPLANT
BAG DECANTER FOR FLEXI CONT (MISCELLANEOUS) ×1 IMPLANT
BANDAGE ESMARK 6X9 LF (GAUZE/BANDAGES/DRESSINGS) IMPLANT
BLADE SAG 18X100X1.27 (BLADE) ×1 IMPLANT
BLADE SAW SGTL 11.0X1.19X90.0M (BLADE) IMPLANT
BLADE SAW SGTL 73X25 THK (BLADE) ×1 IMPLANT
BNDG ESMARK 6X9 LF (GAUZE/BANDAGES/DRESSINGS)
BOWL CEMENT MIXING ADV NOZZLE (MISCELLANEOUS) IMPLANT
BOWL SMART MIX CTS (DISPOSABLE) ×1 IMPLANT
CEMENT BONE REFOBACIN R1X40 US (Cement) IMPLANT
CLSR STERI-STRIP ANTIMIC 1/2X4 (GAUZE/BANDAGES/DRESSINGS) ×2 IMPLANT
COMP FEM CEMT PERSONA STD SZ7 (Knees) ×1 IMPLANT
COMPONENT FEM CMT PRSN STD SZ7 (Knees) IMPLANT
COOLER ICEMAN CLASSIC (MISCELLANEOUS) ×1 IMPLANT
COVER SURGICAL LIGHT HANDLE (MISCELLANEOUS) ×1 IMPLANT
CUFF TOURN SGL QUICK 34 (TOURNIQUET CUFF) ×1
CUFF TOURN SGL QUICK 42 (TOURNIQUET CUFF) IMPLANT
CUFF TRNQT CYL 34X4.125X (TOURNIQUET CUFF) ×1 IMPLANT
DERMABOND ADVANCED .7 DNX12 (GAUZE/BANDAGES/DRESSINGS) ×1 IMPLANT
DRAPE EXTREMITY T 121X128X90 (DISPOSABLE) ×1 IMPLANT
DRAPE HALF SHEET 40X57 (DRAPES) ×1 IMPLANT
DRAPE INCISE IOBAN 66X45 STRL (DRAPES) ×1 IMPLANT
DRAPE ORTHO SPLIT 77X108 STRL (DRAPES)
DRAPE POUCH INSTRU U-SHP 10X18 (DRAPES) ×1 IMPLANT
DRAPE SURG ORHT 6 SPLT 77X108 (DRAPES) IMPLANT
DRAPE U-SHAPE 47X51 STRL (DRAPES) ×2 IMPLANT
DRSG AQUACEL AG ADV 3.5X10 (GAUZE/BANDAGES/DRESSINGS) ×1 IMPLANT
DURAPREP 26ML APPLICATOR (WOUND CARE) ×3 IMPLANT
ELECT CAUTERY BLADE 6.4 (BLADE) ×1 IMPLANT
ELECT PENCIL ROCKER SW 15FT (MISCELLANEOUS) ×1 IMPLANT
ELECT REM PT RETURN 9FT ADLT (ELECTROSURGICAL) ×1
ELECTRODE REM PT RTRN 9FT ADLT (ELECTROSURGICAL) ×1 IMPLANT
GLOVE BIOGEL PI IND STRL 7.0 (GLOVE) ×2 IMPLANT
GLOVE BIOGEL PI IND STRL 7.5 (GLOVE) ×5 IMPLANT
GLOVE ECLIPSE 7.0 STRL STRAW (GLOVE) ×3 IMPLANT
GLOVE INDICATOR 7.0 STRL GRN (GLOVE) ×1 IMPLANT
GLOVE INDICATOR 7.5 STRL GRN (GLOVE) ×1 IMPLANT
GLOVE SURG SYN 7.5  E (GLOVE) ×2
GLOVE SURG SYN 7.5 E (GLOVE) ×2 IMPLANT
GLOVE SURG SYN 7.5 PF PI (GLOVE) ×2 IMPLANT
GLOVE SURG UNDER LTX SZ7.5 (GLOVE) ×2 IMPLANT
GLOVE SURG UNDER POLY LF SZ7 (GLOVE) ×2 IMPLANT
GOWN STRL REIN XL XLG (GOWN DISPOSABLE) ×1 IMPLANT
GOWN STRL REUS W/ TWL LRG LVL3 (GOWN DISPOSABLE) ×1 IMPLANT
GOWN STRL REUS W/TWL LRG LVL3 (GOWN DISPOSABLE) ×1
GOWN TOGA ZIPPER T7+ PEEL AWAY (MISCELLANEOUS) ×2 IMPLANT
HANDPIECE INTERPULSE COAX TIP (DISPOSABLE) ×1
HDLS TROCR DRIL PIN KNEE 75 (PIN) ×1
HOOD PEEL AWAY FLYTE STAYCOOL (MISCELLANEOUS) ×1 IMPLANT
KIT BASIN OR (CUSTOM PROCEDURE TRAY) ×1 IMPLANT
KIT TURNOVER KIT B (KITS) ×1 IMPLANT
MANIFOLD NEPTUNE II (INSTRUMENTS) ×1 IMPLANT
MARKER SKIN DUAL TIP RULER LAB (MISCELLANEOUS) ×2 IMPLANT
NDL SPNL 18GX3.5 QUINCKE PK (NEEDLE) ×1 IMPLANT
NEEDLE SPNL 18GX3.5 QUINCKE PK (NEEDLE) ×1 IMPLANT
NS IRRIG 1000ML POUR BTL (IV SOLUTION) ×1 IMPLANT
PACK TOTAL JOINT (CUSTOM PROCEDURE TRAY) ×1 IMPLANT
PAD ARMBOARD 7.5X6 YLW CONV (MISCELLANEOUS) ×2 IMPLANT
PAD COLD SHLDR WRAP-ON (PAD) ×1 IMPLANT
PIN DRILL HDLS TROCAR 75 4PK (PIN) IMPLANT
SAW OSC TIP CART 19.5X105X1.3 (SAW) ×1 IMPLANT
SCREW FEMALE HEX FIX 25X2.5 (ORTHOPEDIC DISPOSABLE SUPPLIES) IMPLANT
SET HNDPC FAN SPRY TIP SCT (DISPOSABLE) ×1 IMPLANT
SOLUTION PRONTOSAN WOUND 350ML (IRRIGATION / IRRIGATOR) ×1 IMPLANT
STAPLER VISISTAT 35W (STAPLE) IMPLANT
STEM POLY PAT PLY 32M KNEE (Knees) IMPLANT
STEM TIBIA 5 DEG SZ E L KNEE (Knees) IMPLANT
STEM TIBIAL SZ6-7 E-F10 (Stem) IMPLANT
SUCTION FRAZIER HANDLE 10FR (MISCELLANEOUS) ×1
SUCTION TUBE FRAZIER 10FR DISP (MISCELLANEOUS) ×1 IMPLANT
SUT ETHILON 2 0 FS 18 (SUTURE) IMPLANT
SUT MNCRL AB 3-0 PS2 27 (SUTURE) IMPLANT
SUT VIC AB 0 CT1 27 (SUTURE) ×2
SUT VIC AB 0 CT1 27XBRD ANBCTR (SUTURE) ×2 IMPLANT
SUT VIC AB 1 CTX 27 (SUTURE) ×3 IMPLANT
SUT VIC AB 2-0 CT1 27 (SUTURE) ×4
SUT VIC AB 2-0 CT1 TAPERPNT 27 (SUTURE) ×4 IMPLANT
SYR 50ML LL SCALE MARK (SYRINGE) ×2 IMPLANT
TIBIA STEM 5 DEG SZ E L KNEE (Knees) ×1 IMPLANT
TOWEL GREEN STERILE (TOWEL DISPOSABLE) ×1 IMPLANT
TOWEL GREEN STERILE FF (TOWEL DISPOSABLE) ×1 IMPLANT
TRAY CATH 16FR W/PLASTIC CATH (SET/KITS/TRAYS/PACK) IMPLANT
TRAY FOLEY SLVR 16FR LF STAT (SET/KITS/TRAYS/PACK) IMPLANT
TUBE SUCT ARGYLE STRL (TUBING) ×1 IMPLANT
UNDERPAD 30X36 HEAVY ABSORB (UNDERPADS AND DIAPERS) ×1 IMPLANT
WATER STERILE IRR 1000ML POUR (IV SOLUTION) IMPLANT
YANKAUER SUCT BULB TIP NO VENT (SUCTIONS) ×2 IMPLANT

## 2022-03-19 NOTE — Plan of Care (Signed)
  Problem: Education: Goal: Knowledge of the prescribed therapeutic regimen will improve Outcome: Progressing   Problem: Activity: Goal: Ability to avoid complications of mobility impairment will improve Outcome: Progressing   Problem: Pain Management: Goal: Pain level will decrease with appropriate interventions Outcome: Progressing   Problem: Skin Integrity: Goal: Will show signs of wound healing Outcome: Progressing   Problem: Education: Goal: Knowledge of General Education information will improve Description: Including pain rating scale, medication(s)/side effects and non-pharmacologic comfort measures Outcome: Progressing   Problem: Activity: Goal: Risk for activity intolerance will decrease Outcome: Progressing   Problem: Pain Managment: Goal: General experience of comfort will improve Outcome: Progressing   Problem: Safety: Goal: Ability to remain free from injury will improve Outcome: Progressing   Problem: Skin Integrity: Goal: Risk for impaired skin integrity will decrease Outcome: Progressing   

## 2022-03-19 NOTE — Anesthesia Preprocedure Evaluation (Addendum)
Anesthesia Evaluation  Patient identified by MRN, date of birth, ID band Patient awake    Reviewed: Allergy & Precautions, NPO status , Patient's Chart, lab work & pertinent test results  History of Anesthesia Complications (+) PONV and history of anesthetic complications  Airway Mallampati: III  TM Distance: >3 FB Neck ROM: Full    Dental  (+) Teeth Intact, Dental Advisory Given   Pulmonary sleep apnea and Continuous Positive Airway Pressure Ventilation    Pulmonary exam normal breath sounds clear to auscultation       Cardiovascular hypertension, Pt. on medications Normal cardiovascular exam Rhythm:Regular Rate:Normal     Neuro/Psych  Headaches PSYCHIATRIC DISORDERS  Depression     Neuromuscular disease    GI/Hepatic Neg liver ROS,GERD  ,,  Endo/Other  Obesity   Renal/GU negative Renal ROS     Musculoskeletal  (+) Arthritis , Osteoarthritis,    Abdominal   Peds  Hematology negative hematology ROS (+) Plt 374k   Anesthesia Other Findings Day of surgery medications reviewed with the patient.  Reproductive/Obstetrics                             Anesthesia Physical Anesthesia Plan  ASA: 2  Anesthesia Plan: Spinal   Post-op Pain Management: Regional block* and Tylenol PO (pre-op)*   Induction:   PONV Risk Score and Plan: 3 and TIVA, Dexamethasone and Ondansetron  Airway Management Planned: Natural Airway and Simple Face Mask  Additional Equipment:   Intra-op Plan:   Post-operative Plan:   Informed Consent: I have reviewed the patients History and Physical, chart, labs and discussed the procedure including the risks, benefits and alternatives for the proposed anesthesia with the patient or authorized representative who has indicated his/her understanding and acceptance.     Dental advisory given  Plan Discussed with: CRNA, Anesthesiologist and Surgeon  Anesthesia Plan  Comments: (Discussed risks and benefits of and differences between spinal and general. Discussed risks of spinal including headache, backache, failure, bleeding, infection, and nerve damage. Patient consents to spinal. Questions answered. Coagulation studies and platelet count acceptable.)        Anesthesia Quick Evaluation

## 2022-03-19 NOTE — Anesthesia Procedure Notes (Signed)
Anesthesia Regional Block: Adductor canal block   Pre-Anesthetic Checklist: , timeout performed,  Correct Patient, Correct Site, Correct Laterality,  Correct Procedure, Correct Position, site marked,  Risks and benefits discussed,  Surgical consent,  Pre-op evaluation,  At surgeon's request and post-op pain management  Laterality: Left  Prep: chloraprep       Needles:  Injection technique: Single-shot  Needle Type: Echogenic Needle     Needle Length: 9cm  Needle Gauge: 21     Additional Needles:   Procedures:,,,, ultrasound used (permanent image in chart),,    Narrative:  Start time: 03/19/2022 9:38 AM End time: 03/19/2022 9:46 AM Injection made incrementally with aspirations every 5 mL.  Performed by: Personally  Anesthesiologist: Santa Lighter, MD  Additional Notes: No pain on injection. No increased resistance to injection. Injection made in 5cc increments.  Good needle visualization.  Patient tolerated procedure well.

## 2022-03-19 NOTE — Transfer of Care (Signed)
Immediate Anesthesia Transfer of Care Note  Patient: Kimberly Murray  Procedure(s) Performed: LEFT TOTAL KNEE ARTHROPLASTY (Left: Knee)  Patient Location: PACU  Anesthesia Type:Spinal and MAC combined with regional for post-op pain  Level of Consciousness: awake, alert , and patient cooperative  Airway & Oxygen Therapy: Patient Spontanous Breathing  Post-op Assessment: Report given to RN and Post -op Vital signs reviewed and stable  Post vital signs: Reviewed and stable  Last Vitals:  Vitals Value Taken Time  BP 87/57 03/19/22 1315  Temp    Pulse 54 03/19/22 1317  Resp 8 03/19/22 1317  SpO2 100 % 03/19/22 1317  Vitals shown include unvalidated device data.  114/59 BP recheck  Last Pain:  Vitals:   03/19/22 0851  TempSrc:   PainSc: 3       Patients Stated Pain Goal: 3 (37/16/96 7893)  Complications: No notable events documented.

## 2022-03-19 NOTE — Discharge Instructions (Signed)

## 2022-03-19 NOTE — H&P (Signed)
PREOPERATIVE H&P  Chief Complaint: LEFT KNEE OSTEOARTHRITIS  HPI: Kimberly Murray is a 72 y.o. female who presents for surgical treatment of LEFT KNEE OSTEOARTHRITIS.  She denies any changes in medical history.  Past Medical History:  Diagnosis Date   Allergic rhinitis    Chronic headaches    Complication of anesthesia    Depression    on antidepressent medication   Fatty liver    History of acute bronchitis    Hyperlipidemia    Hypertension    Neuropathy    in toes   PONV (postoperative nausea and vomiting)    mild nausea after gallbladder surgery   Pre-diabetes    Sleep apnea    Past Surgical History:  Procedure Laterality Date   BUNIONECTOMY Right    CATARACT EXTRACTION W/ INTRAOCULAR LENS IMPLANT Bilateral    CHOLECYSTECTOMY  2006   EYE SURGERY     macular surgery   LAPAROSCOPIC HYSTERECTOMY  2005   Social History   Socioeconomic History   Marital status: Married    Spouse name: Not on file   Number of children: Not on file   Years of education: Not on file   Highest education level: Not on file  Occupational History   Not on file  Tobacco Use   Smoking status: Never   Smokeless tobacco: Never  Vaping Use   Vaping Use: Never used  Substance and Sexual Activity   Alcohol use: Yes    Comment: once every couple of months   Drug use: Yes    Types: Marijuana    Comment: THC gummies- occasionally   Sexual activity: Not Currently  Other Topics Concern   Not on file  Social History Narrative   Not on file   Social Determinants of Health   Financial Resource Strain: Not on file  Food Insecurity: Not on file  Transportation Needs: Not on file  Physical Activity: Not on file  Stress: Not on file  Social Connections: Not on file   History reviewed. No pertinent family history. Allergies  Allergen Reactions   Codeine Nausea And Vomiting   Erythromycin Nausea And Vomiting   Prior to Admission medications   Medication Sig Start Date End Date Taking?  Authorizing Provider  acetaminophen (TYLENOL) 650 MG CR tablet Take 650 mg by mouth every 8 (eight) hours as needed for pain.   Yes [provider]  aspirin EC 81 MG tablet Take 1 tablet (81 mg total) by mouth 2 (two) times daily. To be taken after surgery to prevent blood clots 03/12/22 03/12/23  Aundra Dubin, PA-C  CANNABIDIOL PO Take 0.25 tablets by mouth daily as needed (pain/sleep). THC GUMMY   Yes [provider]  Cholecalciferol (VITAMIN D) 50 MCG (2000 UT) tablet Take 2,000 Units by mouth every evening.   Yes [provider]  docusate sodium (COLACE) 100 MG capsule Take 1 capsule (100 mg total) by mouth daily as needed. 03/12/22 03/12/23  Aundra Dubin, PA-C  ezetimibe (ZETIA) 10 MG tablet Take 10 mg by mouth every evening. 05/27/21  Yes [provider]  fenofibrate 160 MG tablet Take 160 mg by mouth every evening. 04/19/15  Yes [provider]  loratadine (CLARITIN) 10 MG tablet Take 10 mg by mouth in the morning.   Yes [provider]  losartan-hydrochlorothiazide (HYZAAR) 50-12.5 MG tablet Take 1 tablet by mouth in the morning. 12/15/20  Yes [provider]  meclizine (ANTIVERT) 25 MG tablet Take 25 mg by mouth  2 (two) times daily as needed for dizziness (vertigo). 04/18/12  Yes [provider]  methocarbamol (ROBAXIN-750) 750 MG tablet Take 1 tablet (750 mg total) by mouth 2 (two) times daily as needed for muscle spasms. 03/12/22   Aundra Dubin, PA-C  montelukast (SINGULAIR) 10 MG tablet Take 10 mg by mouth in the morning.   Yes [provider]  ondansetron (ZOFRAN) 4 MG tablet Take 1 tablet (4 mg total) by mouth every 8 (eight) hours as needed for nausea or vomiting. 03/12/22   Aundra Dubin, PA-C  oxyCODONE-acetaminophen (PERCOCET) 5-325 MG tablet Take 1-2 tablets by mouth every 6 (six) hours as needed. To be taken after surgery 03/12/22   Aundra Dubin, PA-C  sertraline (ZOLOFT) 100 MG tablet Take 100  mg by mouth at bedtime.   Yes [provider]  temazepam (RESTORIL) 15 MG capsule Take 1 capsule (15 mg total) by mouth at bedtime as needed for sleep. 01/29/20  Yes Young, Tarri Fuller D, MD  TROLAMINE SALICYLATE EX Apply 1 Application topically 3 (three) times daily as needed (PAIN). [place]   Yes [provider]  venlafaxine XR (EFFEXOR-XR) 150 MG 24 hr capsule Take 150 mg by mouth in the morning. 07/09/18  Yes [provider]  azelastine (ASTELIN) 0.1 % nasal spray Place 1 spray into both nostrils 2 (two) times daily as needed (allergies). 04/14/18   [provider]  scopolamine (TRANSDERM-SCOP) 1 MG/3DAYS Place 1 patch onto the skin every three (3) days as needed (motion sickness (typical with flying)).    [provider]  Semaglutide,0.25 or 0.5MG /DOS, (OZEMPIC, 0.25 OR 0.5 MG/DOSE,) 2 MG/1.5ML SOPN Inject 0.5 mg into the skin once a week. Patient not taking: Reported on 12/26/2021 03/28/21        Positive ROS: All other systems have been reviewed and were otherwise negative with the exception of those mentioned in the HPI and as above.  Physical Exam: General: Alert, no acute distress Cardiovascular: No pedal edema Respiratory: No cyanosis, no use of accessory musculature GI: abdomen soft Skin: No lesions in the area of chief complaint Neurologic: Sensation intact distally Psychiatric: Patient is competent for consent with normal mood and affect Lymphatic: no lymphedema  MUSCULOSKELETAL: exam stable  Assessment: LEFT KNEE OSTEOARTHRITIS  Plan: Plan for Procedure(s): LEFT TOTAL KNEE ARTHROPLASTY  The risks benefits and alternatives were discussed with the patient including but not limited to the risks of nonoperative treatment, versus surgical intervention including infection, bleeding, nerve injury,  blood clots, cardiopulmonary complications, morbidity, mortality, among others, and they were willing to proceed.   Eduard Roux, MD 03/19/2022 8:51 AM

## 2022-03-19 NOTE — Op Note (Signed)
Total Knee Arthroplasty Procedure Note  Preoperative diagnosis: Left knee osteoarthritis  Postoperative diagnosis:same  Operative findings: Complete loss of articular cartilage  Operative procedure: Left total knee arthroplasty. CPT [phone]  Surgeon: N. Eduard Roux, MD  Assist: Ky Barban, RNFA  Anesthesia: Spinal, regional, local  Tourniquet time: see anesthesia record  Implants used: Zimmer persona cemented Femur: CR 7 standard Tibia: E Patella: 32 mm Polyethylene: 10 mm, medial congruent  Indication: Kimberly Murray is a 72 y.o. year old female with a history of knee pain. Having failed conservative management, the patient elected to proceed with a total knee arthroplasty.  We have reviewed the risk and benefits of the surgery and they elected to proceed after voicing understanding.  Procedure:  After informed consent was obtained and understanding of the risk were voiced including but not limited to bleeding, infection, damage to surrounding structures including nerves and vessels, blood clots, leg length inequality and the failure to achieve desired results, the operative extremity was marked with verbal confirmation of the patient in the holding area.   The patient was then brought to the operating room and transported to the operating room table in the supine position.  A tourniquet was applied to the operative extremity around the upper thigh. The operative limb was then prepped and draped in the usual sterile fashion and preoperative antibiotics were administered.  A time out was performed prior to the start of surgery confirming the correct extremity, preoperative antibiotic administration, as well as team members, implants and instruments available for the case. Correct surgical site was also confirmed with preoperative radiographs. The limb was then elevated for exsanguination and the tourniquet was inflated. A midline incision was made and a standard medial parapatellar  approach was performed.  The infrapatellar fat pad was removed.  Suprapatellar synovium was removed to reveal the anterior distal femoral cortex.  A medial peel was performed to release the capsule of the medial tibial plateau.  The patella was then everted and was prepared and sized to a 32 mm.  A cover was placed on the patella for protection from retractors.  The knee was then brought into full flexion and we then turned our attention to the femur.  The cruciates were sacrificed.  Start site was drilled in the femur and the intramedullary distal femoral cutting guide was placed, set at 5 degrees valgus, taking 10 mm of distal resection. The distal cut was made. Osteophytes were then removed.  Next, the proximal tibial cutting guide was placed with appropriate slope, varus/valgus alignment and depth of resection. The proximal tibial cut was made taking 4 mm off the low side. Gap blocks were then used to assess the extension gap and alignment, and appropriate soft tissue releases were performed. Attention was turned back to the femur, which was sized using the sizing guide to a size 7. Appropriate rotation of the femoral component was determined using epicondylar axis, Whiteside's line, and assessing the flexion gap under ligament tension. The appropriate size 4-in-1 cutting block was placed and checked with an angel wing and cuts were made. Posterior femoral osteophytes and uncapped bone were then removed with the curved osteotome.  Trial components were placed, and stability was checked in full extension, mid-flexion, and deep flexion. Proper tibial rotation was determined and marked.  The patella tracked well without a lateral release.  The femoral lugs were then drilled. Trial components were then removed and tibial preparation performed.  The tibia was sized for a size E component.  The bony surfaces were irrigated with a pulse lavage and then dried. Bone cement was vacuum mixed on the back table, and the  final components sized above were cemented into place.  Antibiotic irrigation was placed in the knee joint and soft tissues while the cement cured.  After cement had finished curing, excess cement was removed. The stability of the construct was re-evaluated throughout a range of motion and found to be acceptable. The trial liner was removed, the knee was copiously irrigated, and the knee was re-evaluated for any excess bone debris. The real polyethylene liner, 10 mm thick, was inserted and checked to ensure the locking mechanism had engaged appropriately. The tourniquet was deflated and hemostasis was achieved. The wound was irrigated with normal saline.  One gram of vancomycin powder was placed in the surgical bed.  Topical 0.25% bupivacaine and meloxicam was placed in the joint for postoperative pain.  Capsular closure was performed with a #1 vicryl, subcutaneous fat closed with a 0 vicryl suture, then subcutaneous tissue closed with interrupted 2.0 vicryl suture. The skin was then closed with a 2.0 nylon and dermabond. A sterile dressing was applied.  The patient was awakened in the operating room and taken to recovery in stable condition. All sponge, needle, and instrument counts were correct at the end of the case.  Tawanna Cooler was necessary for opening, closing, retracting, limb positioning and overall facilitation and completion of the surgery.  Position: supine  Complications: none.  Time Out: performed   Drains/Packing: none  Estimated blood loss: minimal  Returned to Recovery Room: in good condition.   Antibiotics: yes   Mechanical VTE (DVT) Prophylaxis: sequential compression devices, TED thigh-high  Chemical VTE (DVT) Prophylaxis: aspirin  Fluid Replacement  Crystalloid: see anesthesia record Blood: none  FFP: none   Specimens Removed: 1 to pathology   Sponge and Instrument Count Correct? yes   PACU: portable radiograph - knee AP and Lateral   Plan/RTC: Return in 2  weeks for wound check.   Weight Bearing/Load Lower Extremity: full   Implant Name Type Inv. Item Serial No. Manufacturer Lot No. LRB No. Used Action  CEMENT BONE REFOBACIN [id number] [place] - [id number] Cement CEMENT BONE REFOBACIN R1X40 [place]  ZIMMER RECON(ORTH,TRAU,BIO,SG) [id number] Left 2 Implanted  COMP FEM CEMT PERSONA STD SZ7 - [id number] Knees COMP FEM CEMT PERSONA STD SZ7  ZIMMER RECON(ORTH,TRAU,BIO,SG) [id number] Left 1 Implanted  TIBIA STEM 5 DEG SZ E L KNEE - [id number] Knees TIBIA STEM 5 DEG SZ E L KNEE  ZIMMER RECON(ORTH,TRAU,BIO,SG) [id number] Left 1 Implanted  STEM TIBIAL SZ6-7 E-F10 - [id number] Stem STEM TIBIAL SZ6-7 E-F10  ZIMMER RECON(ORTH,TRAU,BIO,SG) [id number] Left 1 Implanted  STEM POLY PAT PLY 1M KNEE - [id number] Knees STEM POLY PAT PLY 1M KNEE  ZIMMER RECON(ORTH,TRAU,BIO,SG) [id number] Left 1 Implanted    N. Eduard Roux, MD [facility] 12:23 PM

## 2022-03-19 NOTE — Evaluation (Signed)
Physical Therapy Evaluation Patient Details Name: Kimberly Murray MRN: [id number] DOB: Jul 01, 1950 Today's Date: 03/19/2022  History of Present Illness  72 y.o. female presents to [facility] hospital on 03/19/2022 for elective L TKA. PMH includes depression, chronic headaches, HTN, HLD, neuropathy, PONV, OSA.  Clinical Impression  Pt presents to PT with deficits in strength, ROM, power, gait, balance, functional mobility. Pt is able to transfer and ambulate for limited household distances without physical assistance. Pt demonstrates good activation of LLE musculature post-surgery, PT provides education on HEP to improve strength and ROM. Pt will benefit from aggressive mobilization in an effort to restore independence and reduce pain. PT anticipates discharge home tomorrow.       Recommendations for follow up therapy are one component of a multi-disciplinary discharge planning process, led by the attending physician.  Recommendations may be updated based on patient status, additional functional criteria and insurance authorization.  Follow Up Recommendations Follow physician's recommendations for discharge plan and follow up therapies      Assistance Recommended at Discharge PRN  Patient can return home with the following  A little help with bathing/dressing/bathroom;Assistance with cooking/housework;Assist for transportation;Help with stairs or ramp for entrance    Equipment Recommendations None recommended by PT  Recommendations for Other Services       Functional Status Assessment Patient has had a recent decline in their functional status and demonstrates the ability to make significant improvements in function in a reasonable and predictable amount of time.     Precautions / Restrictions Precautions Precautions: Knee;Fall Precaution Booklet Issued: Yes (comment) Restrictions Weight Bearing Restrictions: Yes LLE Weight Bearing: Weight bearing as tolerated      Mobility  Bed  Mobility Overal bed mobility: Needs Assistance Bed Mobility: Supine to Sit, Sit to Supine     Supine to sit: Supervision, HOB elevated Sit to supine: Supervision, HOB elevated        Transfers Overall transfer level: Needs assistance Equipment used: Rolling walker (2 wheels) Transfers: Sit to/from Stand Sit to Stand: Min guard                Ambulation/Gait Ambulation/Gait assistance: Min guard Gait Distance (Feet): 20 Feet Assistive device: Rolling walker (2 wheels) Gait Pattern/deviations: Step-to pattern Gait velocity: reduced Gait velocity interpretation: <1.31 ft/sec, indicative of household ambulator   General Gait Details: slowed step-to gait  Stairs            Wheelchair Mobility    Modified Rankin (Stroke Patients Only)       Balance Overall balance assessment: Needs assistance Sitting-balance support: No upper extremity supported, Feet supported Sitting balance-Leahy Scale: Good     Standing balance support: Bilateral upper extremity supported, Reliant on assistive device for balance Standing balance-Leahy Scale: Poor                               Pertinent Vitals/Pain Pain Assessment Pain Assessment: 0-10 Pain Score: 5  Pain Location: L knee Pain Descriptors / Indicators: Aching Pain Intervention(s): Monitored during session    Home Living Family/patient expects to be discharged to:: Private residence Living Arrangements: Spouse/significant other;Other relatives Available Help at Discharge: Family;Available 24 hours/day Type of Home: House Home Access: Stairs to enter Entrance Stairs-Rails: None Entrance Stairs-Number of Steps: 2   Home Layout: One level Home Equipment: Conservation officer, nature (2 wheels);Rollator (4 wheels);Shower seat - built in;Grab bars - tub/shower      Prior Function Prior Level of Function :  Independent/Modified Independent;Driving             Mobility Comments: utilizes rollator for community  mobility       Hand Dominance        Extremity/Trunk Assessment   Upper Extremity Assessment Upper Extremity Assessment: Overall WFL for tasks assessed    Lower Extremity Assessment Lower Extremity Assessment: LLE deficits/detail LLE Deficits / Details: ankle WFL, knee extension 3/5, hip WFL    Cervical / Trunk Assessment Cervical / Trunk Assessment: Normal  Communication   Communication: No difficulties  Cognition Arousal/Alertness: Awake/alert Behavior During Therapy: WFL for tasks assessed/performed Overall Cognitive Status: Within Functional Limits for tasks assessed                                          General Comments General comments (skin integrity, edema, etc.): VSS on RA    Exercises     Assessment/Plan    PT Assessment Patient needs continued PT services  PT Problem List Decreased range of motion;Decreased strength;Decreased activity tolerance;Decreased balance;Decreased mobility;Decreased knowledge of use of DME;Pain       PT Treatment Interventions DME instruction;Gait training;Stair training;Functional mobility training;Therapeutic exercise;Therapeutic activities;Balance training;Neuromuscular re-education;Patient/family education    PT Goals (Current goals can be found in the Care Plan section)  Acute Rehab PT Goals Patient Stated Goal: to return to independence PT Goal Formulation: With patient Time For Goal Achievement: 03/24/22 Potential to Achieve Goals: Good    Frequency 7X/week     Co-evaluation               AM-PAC PT "6 Clicks" Mobility  Outcome Measure Help needed turning from your back to your side while in a flat bed without using bedrails?: A Little Help needed moving from lying on your back to sitting on the side of a flat bed without using bedrails?: A Little Help needed moving to and from a bed to a chair (including a wheelchair)?: A Little Help needed standing up from a chair using your arms (e.g.,  wheelchair or bedside chair)?: A Little Help needed to walk in hospital room?: A Little Help needed climbing 3-5 steps with a railing? : Total 6 Click Score: 16    End of Session   Activity Tolerance: Patient tolerated treatment well Patient left: in bed;with call bell/phone within reach;with bed alarm set Nurse Communication: Mobility status PT Visit Diagnosis: Other abnormalities of gait and mobility (R26.89);Muscle weakness (generalized) (M62.81);Pain Pain - Right/Left: Left Pain - part of body: Knee    Time: 1715-1740 PT Time Calculation (min) (ACUTE ONLY): 25 min   Charges:   PT Evaluation $PT Eval Low Complexity: 1 Low          Zenaida Niece, PT, DPT Acute Rehabilitation Office [phone]   Zenaida Niece 03/19/2022, 5:48 PM

## 2022-03-20 ENCOUNTER — Encounter (HOSPITAL_COMMUNITY): Payer: Self-pay | Admitting: Orthopaedic Surgery

## 2022-03-20 DIAGNOSIS — Z96652 Presence of left artificial knee joint: Secondary | ICD-10-CM | POA: Diagnosis not present

## 2022-03-20 DIAGNOSIS — M1712 Unilateral primary osteoarthritis, left knee: Secondary | ICD-10-CM | POA: Diagnosis not present

## 2022-03-20 LAB — CBC
HCT: 39 % (ref 36.0–46.0)
Hemoglobin: 13.2 g/dL (ref 12.0–15.0)
MCH: 31.9 pg (ref 26.0–34.0)
MCHC: 33.8 g/dL (ref 30.0–36.0)
MCV: 94.2 fL (ref 80.0–100.0)
Platelets: 284 10*3/uL (ref 150–400)
RBC: 4.14 MIL/uL (ref 3.87–5.11)
RDW: 13 % (ref 11.5–15.5)
WBC: 10.7 10*3/uL — ABNORMAL HIGH (ref 4.0–10.5)
nRBC: 0 % (ref 0.0–0.2)

## 2022-03-20 MED ORDER — BUPIVACAINE IN DEXTROSE 0.75-8.25 % IT SOLN
INTRATHECAL | Status: DC | PRN
  Administered 2022-03-19: 1.6 mL via INTRATHECAL

## 2022-03-20 NOTE — Plan of Care (Signed)
  Problem: Education: Goal: Knowledge of the prescribed therapeutic regimen will improve Outcome: Adequate for Discharge Goal: Individualized Educational Video(s) Outcome: Adequate for Discharge   Problem: Activity: Goal: Ability to avoid complications of mobility impairment will improve Outcome: Adequate for Discharge Goal: Range of joint motion will improve Outcome: Adequate for Discharge   Problem: Clinical Measurements: Goal: Postoperative complications will be avoided or minimized Outcome: Adequate for Discharge   Problem: Pain Management: Goal: Pain level will decrease with appropriate interventions Outcome: Adequate for Discharge   Problem: Skin Integrity: Goal: Will show signs of wound healing Outcome: Adequate for Discharge   Problem: Education: Goal: Knowledge of General Education information will improve Description: Including pain rating scale, medication(s)/side effects and non-pharmacologic comfort measures Outcome: Adequate for Discharge   Problem: Health Behavior/Discharge Planning: Goal: Ability to manage health-related needs will improve Outcome: Adequate for Discharge   Problem: Clinical Measurements: Goal: Ability to maintain clinical measurements within normal limits will improve Outcome: Adequate for Discharge Goal: Will remain free from infection Outcome: Adequate for Discharge Goal: Diagnostic test results will improve Outcome: Adequate for Discharge Goal: Respiratory complications will improve Outcome: Adequate for Discharge Goal: Cardiovascular complication will be avoided Outcome: Adequate for Discharge   Problem: Activity: Goal: Risk for activity intolerance will decrease Outcome: Adequate for Discharge   Problem: Nutrition: Goal: Adequate nutrition will be maintained Outcome: Adequate for Discharge   Problem: Coping: Goal: Level of anxiety will decrease Outcome: Adequate for Discharge   Problem: Elimination: Goal: Will not  experience complications related to bowel motility Outcome: Adequate for Discharge Goal: Will not experience complications related to urinary retention Outcome: Adequate for Discharge   Problem: Pain Managment: Goal: General experience of comfort will improve Outcome: Adequate for Discharge   Problem: Safety: Goal: Ability to remain free from injury will improve Outcome: Adequate for Discharge   Problem: Skin Integrity: Goal: Risk for impaired skin integrity will decrease Outcome: Adequate for Discharge   Problem: Acute Rehab PT Goals(only PT should resolve) Goal: Pt Will Go Supine/Side To Sit Outcome: Adequate for Discharge Goal: Pt Will Go Sit To Supine/Side Outcome: Adequate for Discharge Goal: Patient Will Transfer Sit To/From Stand Outcome: Adequate for Discharge Goal: Pt Will Transfer Bed To Chair/Chair To Bed Outcome: Adequate for Discharge Goal: Pt Will Ambulate Outcome: Adequate for Discharge Goal: Pt Will Go Up/Down Stairs Outcome: Adequate for Discharge   Problem: Acute Rehab PT Goals(only PT should resolve) Goal: Pt/caregiver will Perform Home Exercise Program Outcome: Adequate for Discharge

## 2022-03-20 NOTE — Anesthesia Procedure Notes (Signed)
Spinal  Patient location during procedure: OR Start time: 03/19/2022 10:49 AM End time: 03/19/2022 10:52 AM Reason for block: surgical anesthesia Staffing Performed: anesthesiologist  Anesthesiologist: Santa Lighter, MD Performed by: Santa Lighter, MD Authorized by: Santa Lighter, MD   Preanesthetic Checklist Completed: patient identified, IV checked, risks and benefits discussed, surgical consent, monitors and equipment checked, pre-op evaluation and timeout performed Spinal Block Patient position: sitting Prep: DuraPrep and site prepped and draped Patient monitoring: continuous pulse ox and blood pressure Approach: midline Location: L3-4 Injection technique: single-shot Needle Needle type: Pencan  Needle gauge: 24 G Assessment Events: CSF return Additional Notes Functioning IV was confirmed and monitors were applied. Sterile prep and drape, including hand hygiene, mask and sterile gloves were used. The patient was positioned and the spine was prepped. The skin was anesthetized with lidocaine.  Free flow of clear CSF was obtained prior to injecting local anesthetic into the CSF.  The spinal needle aspirated freely following injection.  The needle was carefully withdrawn.  The patient tolerated the procedure well. Consent was obtained prior to procedure with all questions answered and concerns addressed. Risks including but not limited to bleeding, infection, nerve damage, paralysis, failed block, inadequate analgesia, allergic reaction, high spinal, itching and headache were discussed and the patient wished to proceed.   Hoy Morn, MD

## 2022-03-20 NOTE — TOC Transition Note (Addendum)
Transition of Care [facility]) - CM/SW Discharge Note   Patient Details  Name: Kimberly Murray MRN: [id number] Date of Birth: [birth year]  Transition of [facility] CM/SW Contact:  Sharin Mons, RN Phone Number: 03/20/2022, 10:11 AM   Clinical Narrative:    Patient will DC to: home Anticipated DC date: 03/20/2022 Family notified: yes Transport by: car        - Left total knee arthroplasty, 1/29 Per MD patient ready for DC today pending PT's clearance and voiding. RN, patient, patient's husband, and Victor notified of DC plan.Pt without DME needs. Equipment needs were arranged prior to surgery, pt already has equipment @ home. Husband to assist with care once d/c.  RX meds , pt without concerns. Post hospital f/cu noted on AVS. Husband to provide transportation to home.  RNCM will sign off for now as intervention is no longer needed. Please consult [place] again if new needs arise.    Final next level of care: [place] Barriers to Discharge: No Barriers Identified   Patient Goals and CMS Choice   Choice offered to / list presented to : Patient  Discharge Placement                         Discharge Plan and Services Additional resources added to the After Visit Summary for                            [facility] Arranged: PT HH Agency: [place] Date [place]: 03/20/22 Time HH Agency Contacted: 1011    Social Determinants of Health (SDOH) Interventions SDOH Screenings   Food Insecurity: No Food Insecurity (03/19/2022)  Transportation Needs: No Transportation Needs (03/19/2022)  Utilities: Not At Risk (03/19/2022)  Tobacco Use: Low Risk  (03/19/2022)     Readmission Risk Interventions     No data to display

## 2022-03-20 NOTE — Plan of Care (Signed)
  Problem: Education: Goal: Knowledge of General Education information will improve Description: Including pain rating scale, medication(s)/side effects and non-pharmacologic comfort measures Outcome: Adequate for Discharge   Problem: Health Behavior/Discharge Planning: Goal: Ability to manage health-related needs will improve Outcome: Adequate for Discharge   Problem: Activity: Goal: Risk for activity intolerance will decrease Outcome: Adequate for Discharge   Problem: Nutrition: Goal: Adequate nutrition will be maintained Outcome: Adequate for Discharge   Problem: Coping: Goal: Level of anxiety will decrease Outcome: Adequate for Discharge   Problem: Elimination: Goal: Will not experience complications related to bowel motility Outcome: Adequate for Discharge   Problem: Pain Managment: Goal: General experience of comfort will improve Outcome: Adequate for Discharge

## 2022-03-20 NOTE — Anesthesia Postprocedure Evaluation (Signed)
Anesthesia Post Note  Patient: Kimberly Murray  Procedure(s) Performed: LEFT TOTAL KNEE ARTHROPLASTY (Left: Knee)     Patient location during evaluation: PACU Anesthesia Type: Spinal Level of consciousness: awake, awake and alert and oriented Pain management: pain level controlled Vital Signs Assessment: post-procedure vital signs reviewed and stable Respiratory status: spontaneous breathing, nonlabored ventilation and respiratory function stable Cardiovascular status: blood pressure returned to baseline and stable Postop Assessment: no headache, no backache, spinal receding and no apparent nausea or vomiting Anesthetic complications: no   No notable events documented.  Last Vitals:  Vitals:   03/19/22 1922 03/20/22 0629  BP: 128/61 126/66  Pulse: 80 (!) 53  Resp: 18 18  Temp: 36.8 C 36.5 C  SpO2: 95% 98%    Last Pain:  Vitals:   03/20/22 0629  TempSrc: Oral  PainSc: [place]

## 2022-03-20 NOTE — Progress Notes (Signed)
   Subjective:  Patient reports pain as mild.  Walked 20 ft with PT yesterday.  Objective:   VITALS:   Vitals:   03/19/22 1922 03/20/22 0629 03/20/22 0712 03/20/22 0725  BP: 128/61 126/66 (!) 126/58 (!) 118/43  Pulse: 80 (!) 53 61 73  Resp: 18 18    Temp: 98.3 F (36.8 C) 97.7 F (36.5 C) 98.5 F (36.9 C) 97.7 F (36.5 C)  TempSrc: Oral Oral Oral Oral  SpO2: 95% 98% 100% 98%  Weight:      Height:        Neurovascular intact Sensation intact distally Intact pulses distally Dorsiflexion/Plantar flexion intact Incision: dressing C/D/I and no drainage   Lab Results  Component Value Date   WBC 10.7 (H) 03/20/2022   HGB 13.2 03/20/2022   HCT 39.0 03/20/2022   MCV 94.2 03/20/2022   PLT 284 03/20/2022     Assessment/Plan:  1 Day Post-Op   - Expected postop acute blood loss anemia - Up with PT/OT - anticipate will clear today - DVT ppx - SCDs, ambulation, aspirin - WBAT operative extremity - Pain control - Discharge planning - home today after PT and able to void  Eduard Roux 03/20/2022, 9:26 AM

## 2022-03-20 NOTE — Addendum Note (Signed)
Addendum  created 03/20/22 0710 by Santa Lighter, MD   Child order released for a procedure order, Clinical Note Signed, Intraprocedure Blocks edited, Intraprocedure Meds edited, SmartForm saved

## 2022-03-20 NOTE — Discharge Summary (Signed)
Patient ID: Kimberly Murray MRN: [id number] DOB/AGE: 71-Jun-1952 71 y.o.  Admit date: 03/19/2022 Discharge date: 03/20/2022  Admission Diagnoses:  Primary osteoarthritis of left knee  Discharge Diagnoses:  Principal Problem:   Primary osteoarthritis of left knee Active Problems:   Status post total left knee replacement   Past Medical History:  Diagnosis Date   Allergic rhinitis    Chronic headaches    Complication of anesthesia    Depression    on antidepressent medication   Fatty liver    History of acute bronchitis    Hyperlipidemia    Hypertension    Neuropathy    in toes   PONV (postoperative nausea and vomiting)    mild nausea after gallbladder surgery   Pre-diabetes    Sleep apnea     Surgeries: Procedure(s): LEFT TOTAL KNEE ARTHROPLASTY on 03/19/2022   Consultants (if any):   Discharged Condition: Improved  Hospital Course: Kimberly Murray is an 72 y.o. female who was admitted 03/19/2022 with a diagnosis of Primary osteoarthritis of left knee and went to the operating room on 03/19/2022 and underwent the above named procedures.    She was given perioperative antibiotics:  Anti-infectives (From admission, onward)    Start     Dose/Rate Route Frequency Ordered Stop   03/19/22 1800  ceFAZolin (ANCEF) IVPB 2g/100 mL premix        2 g 200 mL/hr over 30 Minutes Intravenous Every 6 hours 03/19/22 1507 03/19/22 2227   03/19/22 1139  vancomycin (VANCOCIN) powder  Status:  Discontinued          As needed 03/19/22 1139 03/19/22 1312   03/19/22 0830  ceFAZolin (ANCEF) IVPB 2g/100 mL premix        2 g 200 mL/hr over 30 Minutes Intravenous On call to O.R. 03/19/22 3810 03/19/22 1055     .  She was given sequential compression devices, early ambulation, and appropriate chemoprophylaxis for DVT prophylaxis.  She benefited maximally from the hospital stay and there were no complications.    Recent vital signs:  Vitals:   03/20/22 0712 03/20/22 0725  BP: (!) 126/58  (!) 118/43  Pulse: 61 73  Resp:    Temp: 98.5 F (36.9 C) 97.7 F (36.5 C)  SpO2: 100% 98%    Recent laboratory studies:  Lab Results  Component Value Date   HGB 13.2 03/20/2022   HGB 15.4 (H) 03/14/2022   Lab Results  Component Value Date   WBC 10.7 (H) 03/20/2022   PLT 284 03/20/2022   No results found for: "INR" Lab Results  Component Value Date   NA 137 03/19/2022   K 4.2 03/19/2022   CL 104 03/19/2022   CO2 23 03/19/2022   BUN 23 03/19/2022   CREATININE 0.76 03/19/2022   GLUCOSE 87 03/19/2022    Discharge Medications:   Allergies as of 03/20/2022       Reactions   Codeine Nausea And Vomiting   Erythromycin Nausea And Vomiting        Medication List     TAKE these medications    acetaminophen 650 MG CR tablet Commonly known as: TYLENOL Take 650 mg by mouth every 8 (eight) hours as needed for pain.   aspirin EC 81 MG tablet Take 1 tablet (81 mg total) by mouth 2 (two) times daily. To be taken after surgery to prevent blood clots   azelastine 0.1 % nasal spray Commonly known as: ASTELIN Place 1 spray into both nostrils 2 (two) times  daily as needed (allergies).   CANNABIDIOL PO Take 0.25 tablets by mouth daily as needed (pain/sleep). THC GUMMY   docusate sodium 100 MG capsule Commonly known as: Colace Take 1 capsule (100 mg total) by mouth daily as needed.   ezetimibe 10 MG tablet Commonly known as: ZETIA Take 10 mg by mouth every evening.   fenofibrate 160 MG tablet Take 160 mg by mouth every evening.   loratadine 10 MG tablet Commonly known as: CLARITIN Take 10 mg by mouth in the morning.   losartan-hydrochlorothiazide 50-12.5 MG tablet Commonly known as: HYZAAR Take 1 tablet by mouth in the morning.   meclizine 25 MG tablet Commonly known as: ANTIVERT Take 25 mg by mouth 2 (two) times daily as needed for dizziness (vertigo).   methocarbamol 750 MG tablet Commonly known as: Robaxin-750 Take 1 tablet (750 mg total) by mouth 2  (two) times daily as needed for muscle spasms.   montelukast 10 MG tablet Commonly known as: SINGULAIR Take 10 mg by mouth in the morning.   ondansetron 4 MG tablet Commonly known as: Zofran Take 1 tablet (4 mg total) by mouth every 8 (eight) hours as needed for nausea or vomiting.   oxyCODONE-acetaminophen 5-325 MG tablet Commonly known as: Percocet Take 1-2 tablets by mouth every 6 (six) hours as needed. To be taken after surgery   Ozempic (0.25 or 0.5 MG/DOSE) 2 MG/1.5ML Sopn Generic drug: Semaglutide(0.25 or 0.5MG /DOS) Inject 0.5 mg into the skin once a week.   scopolamine 1 MG/3DAYS Commonly known as: TRANSDERM-SCOP Place 1 patch onto the skin every three (3) days as needed (motion sickness (typical with flying)).   sertraline 100 MG tablet Commonly known as: ZOLOFT Take 100 mg by mouth at bedtime.   temazepam 15 MG capsule Commonly known as: RESTORIL Take 1 capsule (15 mg total) by mouth at bedtime as needed for sleep.   TROLAMINE SALICYLATE EX Apply 1 Application topically 3 (three) times daily as needed (PAIN). HEMPVANA PAIN RELIEF CREAM   venlafaxine XR 150 MG 24 hr capsule Commonly known as: EFFEXOR-XR Take 150 mg by mouth in the morning.   Vitamin D 50 MCG (2000 UT) tablet Take 2,000 Units by mouth every evening.               Durable Medical Equipment  (From admission, onward)           Start     Ordered   03/19/22 1717  DME Walker rolling  Once       Question Answer Comment  Walker: With 5 Inch Wheels   Patient needs a walker to treat with the following condition Status post left partial knee replacement      03/19/22 1716   03/19/22 1717  DME 3 n 1  Once        03/19/22 1716   03/19/22 1717  DME Bedside commode  Once       Question:  Patient needs a bedside commode to treat with the following condition  Answer:  Status post left partial knee replacement   03/19/22 1716            Diagnostic Studies: DG Knee Left Port  Result  Date: 03/19/2022 CLINICAL DATA:  Post left knee surgery EXAM: PORTABLE LEFT KNEE - 2 VIEW COMPARISON:  12/26/2021 FINDINGS: Status post left total knee arthroplasty. No periprosthetic lucency or fracture. Near anatomic alignment of the prosthetic components. Air within the soft tissues is not unexpected postoperatively. IMPRESSION: Expected postoperative appearance, status  post left total knee arthroplasty. Electronically Signed   By: Merilyn Baba M.D.   On: 03/19/2022 14:50   MM 3D SCREEN BREAST BILATERAL  Result Date: 03/01/2022 CLINICAL DATA:  Screening. EXAM: DIGITAL SCREENING BILATERAL MAMMOGRAM WITH TOMOSYNTHESIS AND CAD TECHNIQUE: Bilateral screening digital craniocaudal and mediolateral oblique mammograms were obtained. Bilateral screening digital breast tomosynthesis was performed. The images were evaluated with computer-aided detection. COMPARISON:  Previous exam(s). ACR Breast Density Category b: There are scattered areas of fibroglandular density. FINDINGS: There are no findings suspicious for malignancy. IMPRESSION: No mammographic evidence of malignancy. A result letter of this screening mammogram will be mailed directly to the patient. RECOMMENDATION: Screening mammogram in one year. (Code:[id number]-01Y) BI-RADS CATEGORY  1: Negative. Electronically Signed   By: Ileana Roup M.D.   On: 03/01/2022 15:26    Disposition: Discharge disposition: 01-Home or Self Care       Discharge Instructions     Call MD / Call 911   Complete by: As directed    If you experience chest pain or shortness of breath, CALL 911 and be transported to the hospital emergency room.  If you develope a fever above 101.5 F, pus (white drainage) or increased drainage or redness at the wound, or calf pain, call your surgeon's office.   Constipation Prevention   Complete by: As directed    Drink plenty of fluids.  Prune juice may be helpful.  You may use a stool softener, such as Colace (over the counter) 100 mg twice a  day.  Use MiraLax (over the counter) for constipation as needed.   Driving restrictions   Complete by: As directed    No driving while taking narcotic pain meds.   Increase activity slowly as tolerated   Complete by: As directed    Post-operative opioid taper instructions:   Complete by: As directed    POST-OPERATIVE OPIOID TAPER INSTRUCTIONS: It is important to wean off of your opioid medication as soon as possible. If you do not need pain medication after your surgery it is ok to stop day one. Opioids include: Codeine, Hydrocodone(Norco, Vicodin), Oxycodone(Percocet, oxycontin) and hydromorphone amongst others.  Long term and even short term use of opiods can cause: Increased pain response Dependence Constipation Depression Respiratory depression And more.  Withdrawal symptoms can include Flu like symptoms Nausea, vomiting And more Techniques to manage these symptoms Hydrate well Eat regular healthy meals Stay active Use relaxation techniques(deep breathing, meditating, yoga) Do Not substitute Alcohol to help with tapering If you have been on opioids for less than two weeks and do not have pain than it is ok to stop all together.  Plan to wean off of opioids This plan should start within one week post op of your joint replacement. Maintain the same interval or time between taking each dose and first decrease the dose.  Cut the total daily intake of opioids by one tablet each day Next start to increase the time between doses. The last dose that should be eliminated is the evening dose.           Follow-up Information     Leandrew Koyanagi, MD Follow up in 2 week(s).   Specialty: Orthopedic Surgery Why: For suture removal, For wound re-check Contact information: [place] [id number] [phone]         [facility] Follow up.   Specialty: Home Health Services Why: home health PT services will be provided by [facility] information: [phone] [phone]                  Signed: Eduard Murray 03/20/2022, 9:27 AM

## 2022-03-20 NOTE — Progress Notes (Signed)
Patient given discharge instructions and verbalized understanding, PIV removed. Patient assisted with dressing.

## 2022-03-20 NOTE — Evaluation (Signed)
Occupational Therapy Evaluation and Discharge Patient Details Name: Kimberly Murray MRN: [id number] DOB: 11/03/1950 Today's Date: 03/20/2022   History of Present Illness 72 y.o. female presents to [facility] hospital on 03/19/2022 for elective L TKA. PMH includes depression, chronic headaches, HTN, HLD, neuropathy, PONV, OSA.   Clinical Impression   This 72 yo female admitted and underwent above presents to acute OT with PLOF of being independent with basic ADLs. Currently she needs some A mainly with LB ADLs and all IADLs --will have from her husband. All OT education completed, we will D/C from acute OT.     Recommendations for follow up therapy are one component of a multi-disciplinary discharge planning process, led by the attending physician.  Recommendations may be updated based on patient status, additional functional criteria and insurance authorization.   Follow Up Recommendations  No OT follow up     Assistance Recommended at Discharge PRN  Patient can return home with the following A little help with walking and/or transfers;A little help with bathing/dressing/bathroom;Assistance with cooking/housework;Assist for transportation;Help with stairs or ramp for entrance    Functional Status Assessment  Patient has had a recent decline in their functional status and demonstrates the ability to make significant improvements in function in a reasonable and predictable amount of time. (without further need for OT services--all education completed with pt and husband)  Equipment Recommendations  None recommended by OT       Precautions / Restrictions Precautions Precautions: Knee;Fall Restrictions Weight Bearing Restrictions: No LLE Weight Bearing: Weight bearing as tolerated         Balance Overall balance assessment: Needs assistance Sitting-balance support: No upper extremity supported, Feet supported Sitting balance-Leahy Scale: Good                                      ADL either performed or assessed with clinical judgement   ADL Overall ADL's : Needs assistance/impaired Eating/Feeding: Independent;Sitting   Grooming: Set up;Sitting   Upper Body Bathing: Set up;Sitting     Lower Body Bathing Details (indicate cue type and reason): Educated on sequence of dressing for efficiency--husband to A prn Upper Body Dressing : Set up;Sitting   Lower Body Dressing: Minimal assistance             Tub/Shower Transfer Details (indicate cue type and reason): We discussed that the shower stall may be big enough to go in and turn around with RW or other option is to back into shower stall. As far as sequencing in and out of shower  "in with the good , out with the bad"         Vision Patient Visual Report: No change from baseline              Pertinent Vitals/Pain Pain Assessment Pain Assessment: 0-10 Pain Score: 5  Pain Location: L knee Pain Descriptors / Indicators: Aching Pain Intervention(s): Ice applied     Hand Dominance Right   Extremity/Trunk Assessment Upper Extremity Assessment Upper Extremity Assessment: Overall WFL for tasks assessed           Communication Communication Communication: No difficulties   Cognition Arousal/Alertness: Awake/alert Behavior During Therapy: WFL for tasks assessed/performed Overall Cognitive Status: Within Functional Limits for tasks assessed  Home Living Family/patient expects to be discharged to:: Private residence Living Arrangements: Spouse/significant other;Children Available Help at Discharge: Family;Available 24 hours/day Type of Home: House Home Access: Stairs to enter [employer] of Steps: 2 Entrance Stairs-Rails: None Home Layout: One level     Bathroom Shower/Tub: Occupational psychologist: Handicapped height Bathroom Accessibility: Yes   Home Equipment: Conservation officer, nature (2  wheels);Rollator (4 wheels);Shower seat - built in;Grab bars - tub/shower;Hand held shower head          Prior Functioning/Environment Prior Level of Function : Independent/Modified Independent;Driving             Mobility Comments: utilizes rollator for community mobility          OT Problem List: Decreased range of motion;Decreased strength;Impaired balance (sitting and/or standing);Pain         OT Goals(Current goals can be found in the care plan section) Acute Rehab OT Goals Patient Stated Goal: to go home today         AM-PAC OT "6 Clicks" Daily Activity     Outcome Measure Help from another person eating meals?: None Help from another person taking care of personal grooming?: A Little Help from another person toileting, which includes using toliet, bedpan, or urinal?: A Little Help from another person bathing (including washing, rinsing, drying)?: A Little Help from another person to put on and taking off regular upper body clothing?: A Little Help from another person to put on and taking off regular lower body clothing?: A Little 6 Click Score: 19   End of Session    Activity Tolerance: Patient tolerated treatment well Patient left: in chair;with call bell/phone within reach;with family/visitor present  OT Visit Diagnosis: Other abnormalities of gait and mobility (R26.89);Pain Pain - Right/Left: Left                Time: 2563-8937 OT Time Calculation (min): 13 min Charges:  OT General Charges $OT Visit: 1 Visit OT Evaluation $OT Eval Low Complexity: 1 Low  [address], OTR/L Acute Rehab Services [facility] [phone] Office [phone]    Almon Register 03/20/2022, 6:21 PM

## 2022-03-20 NOTE — Progress Notes (Signed)
Physical Therapy Treatment Patient Details Name: Kimberly Murray MRN: [id number] DOB: 1950/03/31 Today's Date: 03/20/2022   History of Present Illness 72 y.o. female presents to [facility] hospital on 03/19/2022 for elective L TKA. PMH includes depression, chronic headaches, HTN, HLD, neuropathy, PONV, OSA.    PT Comments    Pt was received supine with husband present and eager to participate in therapy. Pt demonstrates good effort, but is limited by decreased activity tolerance and L knee pain. Pt demonstrated provided HEP LE exercises with cues for technique. Pt was able to progress gait distance and complete stair trial with up to min assist. Pt and husband were educated on safe transfer and stair techniques and they were able to demonstrate them during session. Pt and husband anticipated to be able to safely manage pt mobility needs at home.    Recommendations for follow up therapy are one component of a multi-disciplinary discharge planning process, led by the attending physician.  Recommendations may be updated based on patient status, additional functional criteria and insurance authorization.  Follow Up Recommendations  Follow physician's recommendations for discharge plan and follow up therapies     Assistance Recommended at Discharge PRN  Patient can return home with the following A little help with bathing/dressing/bathroom;Assistance with cooking/housework;Assist for transportation;Help with stairs or ramp for entrance   Equipment Recommendations  None recommended by PT    Recommendations for Other Services       Precautions / Restrictions Precautions Precautions: Knee;Fall Precaution Booklet Issued: Yes (comment) Restrictions Weight Bearing Restrictions: Yes LLE Weight Bearing: Weight bearing as tolerated     Mobility  Bed Mobility Overal bed mobility: Needs Assistance Bed Mobility: Supine to Sit     Supine to sit: Supervision, HOB elevated     General bed mobility  comments: increased time due to pain    Transfers Overall transfer level: Needs assistance Equipment used: Rolling walker (2 wheels) Transfers: Sit to/from Stand Sit to Stand: Min guard           General transfer comment: from EOB to RW x1 with LLE forward; RW to/from couch with transfer education for husband and pt demonstrating decreased eccentric control on descent to lower surface; from RW to recliner with improved eccentric control    Ambulation/Gait Ambulation/Gait assistance: Min guard Gait Distance (Feet): 110 Feet Assistive device: Rolling walker (2 wheels) Gait Pattern/deviations: Step-to pattern, Decreased stance time - left, Antalgic, Trunk flexed Gait velocity: reduced     General Gait Details: Husband educated on guarding techniques with gait belt for safety at home   Stairs Stairs: Yes Stairs assistance: Min assist Stair Management: No rails, With walker, Backwards, Forwards Number of Stairs: 4 General stair comments: Pt does not have rails at home with 2 steps to enter; pt completed ascent/descent of 2 stairs forward with husband providing HHA; pt also completed 2 stairs backwards ascent with walker for UE support      Balance Overall balance assessment: Needs assistance Sitting-balance support: No upper extremity supported, Feet supported Sitting balance-Leahy Scale: Good Sitting balance - Comments: Pt able to lean to each side with UE support and supervision for gown adjustment sitting EOB; Pt also able to lean to perform hygiene tasks in bathroom with supervision   Standing balance support: Bilateral upper extremity supported Standing balance-Leahy Scale: Fair Standing balance comment: With RW support                            Cognition  Arousal/Alertness: Awake/alert Behavior During Therapy: WFL for tasks assessed/performed Overall Cognitive Status: Within Functional Limits for tasks assessed                                           Exercises Total Joint Exercises Ankle Circles/Pumps: AROM, 5 reps Quad Sets: AROM, 5 reps Heel Slides: AROM, 5 reps Hip ABduction/ADduction: AROM, 5 reps Straight Leg Raises: AROM, 5 reps Long Arc Quad: AROM, 5 reps Knee Flexion: AROM, 5 reps Goniometric ROM: 11-65 degrees L knee flexion    General Comments        Pertinent Vitals/Pain Pain Assessment Pain Assessment: 0-10 Pain Score: 5  Pain Location: L knee Pain Descriptors / Indicators: Aching Pain Intervention(s): Limited activity within patient's tolerance, Monitored during session, Patient requesting pain meds-RN notified, Ice applied     PT Goals (current goals can now be found in the care plan section) Acute Rehab PT Goals Patient Stated Goal: to return to independence PT Goal Formulation: With patient Time For Goal Achievement: 03/24/22 Potential to Achieve Goals: Good Progress towards PT goals: Progressing toward goals    Frequency    7X/week      PT Plan Current plan remains appropriate       AM-PAC PT "6 Clicks" Mobility   Outcome Measure  Help needed turning from your back to your side while in a flat bed without using bedrails?: A Little Help needed moving from lying on your back to sitting on the side of a flat bed without using bedrails?: A Little Help needed moving to and from a bed to a chair (including a wheelchair)?: A Little Help needed standing up from a chair using your arms (e.g., wheelchair or bedside chair)?: A Little Help needed to walk in hospital room?: A Little Help needed climbing 3-5 steps with a railing? : A Little 6 Click Score: 18    End of Session Equipment Utilized During Treatment: Gait belt Activity Tolerance: Patient tolerated treatment well Patient left: in chair;with call bell/phone within reach;with family/visitor present Nurse Communication: Mobility status PT Visit Diagnosis: Other abnormalities of gait and mobility (R26.89);Muscle weakness  (generalized) (M62.81);Pain Pain - Right/Left: Left Pain - part of body: Knee     Time: 0952-1030 PT Time Calculation (min) (ACUTE ONLY): 38 min  Charges:  $Gait Training: 23-37 mins $Therapeutic Activity: 8-22 mins                    Michelle Nasuti, PTA Acute Rehabilitation Services Secure [facility] Preferred  [phone]    Michelle Nasuti 03/20/2022, 10:54 AM

## 2022-03-20 NOTE — Plan of Care (Signed)
  Problem: Activity: Goal: Range of joint motion will improve Outcome: Progressing   Problem: Pain Management: Goal: Pain level will decrease with appropriate interventions Outcome: Progressing   Problem: Pain Managment: Goal: General experience of comfort will improve Outcome: Progressing

## 2022-03-21 ENCOUNTER — Telehealth: Payer: Self-pay | Admitting: *Deleted

## 2022-03-21 ENCOUNTER — Other Ambulatory Visit: Payer: Self-pay | Admitting: *Deleted

## 2022-03-21 DIAGNOSIS — G4733 Obstructive sleep apnea (adult) (pediatric): Secondary | ICD-10-CM | POA: Diagnosis not present

## 2022-03-21 DIAGNOSIS — G4700 Insomnia, unspecified: Secondary | ICD-10-CM | POA: Diagnosis not present

## 2022-03-21 DIAGNOSIS — J309 Allergic rhinitis, unspecified: Secondary | ICD-10-CM | POA: Diagnosis not present

## 2022-03-21 DIAGNOSIS — K219 Gastro-esophageal reflux disease without esophagitis: Secondary | ICD-10-CM | POA: Diagnosis not present

## 2022-03-21 DIAGNOSIS — I10 Essential (primary) hypertension: Secondary | ICD-10-CM | POA: Diagnosis not present

## 2022-03-21 DIAGNOSIS — E785 Hyperlipidemia, unspecified: Secondary | ICD-10-CM | POA: Diagnosis not present

## 2022-03-21 DIAGNOSIS — Z96652 Presence of left artificial knee joint: Secondary | ICD-10-CM

## 2022-03-21 DIAGNOSIS — Z9181 History of falling: Secondary | ICD-10-CM | POA: Diagnosis not present

## 2022-03-21 DIAGNOSIS — Z7985 Long-term (current) use of injectable non-insulin antidiabetic drugs: Secondary | ICD-10-CM | POA: Diagnosis not present

## 2022-03-21 DIAGNOSIS — M1712 Unilateral primary osteoarthritis, left knee: Secondary | ICD-10-CM

## 2022-03-21 DIAGNOSIS — G629 Polyneuropathy, unspecified: Secondary | ICD-10-CM | POA: Diagnosis not present

## 2022-03-21 DIAGNOSIS — Z471 Aftercare following joint replacement surgery: Secondary | ICD-10-CM | POA: Diagnosis not present

## 2022-03-21 DIAGNOSIS — F32A Depression, unspecified: Secondary | ICD-10-CM | POA: Diagnosis not present

## 2022-03-21 DIAGNOSIS — Z7982 Long term (current) use of aspirin: Secondary | ICD-10-CM | POA: Diagnosis not present

## 2022-03-21 DIAGNOSIS — Z79899 Other long term (current) drug therapy: Secondary | ICD-10-CM | POA: Diagnosis not present

## 2022-03-21 DIAGNOSIS — R7303 Prediabetes: Secondary | ICD-10-CM | POA: Diagnosis not present

## 2022-03-21 NOTE — Telephone Encounter (Signed)
Ortho bundle D/C call completed. 

## 2022-03-23 DIAGNOSIS — E785 Hyperlipidemia, unspecified: Secondary | ICD-10-CM | POA: Diagnosis not present

## 2022-03-23 DIAGNOSIS — Z96652 Presence of left artificial knee joint: Secondary | ICD-10-CM | POA: Diagnosis not present

## 2022-03-23 DIAGNOSIS — R7303 Prediabetes: Secondary | ICD-10-CM | POA: Diagnosis not present

## 2022-03-23 DIAGNOSIS — K219 Gastro-esophageal reflux disease without esophagitis: Secondary | ICD-10-CM | POA: Diagnosis not present

## 2022-03-23 DIAGNOSIS — Z9181 History of falling: Secondary | ICD-10-CM | POA: Diagnosis not present

## 2022-03-23 DIAGNOSIS — F32A Depression, unspecified: Secondary | ICD-10-CM | POA: Diagnosis not present

## 2022-03-23 DIAGNOSIS — G629 Polyneuropathy, unspecified: Secondary | ICD-10-CM | POA: Diagnosis not present

## 2022-03-23 DIAGNOSIS — Z7985 Long-term (current) use of injectable non-insulin antidiabetic drugs: Secondary | ICD-10-CM | POA: Diagnosis not present

## 2022-03-23 DIAGNOSIS — J309 Allergic rhinitis, unspecified: Secondary | ICD-10-CM | POA: Diagnosis not present

## 2022-03-23 DIAGNOSIS — Z7982 Long term (current) use of aspirin: Secondary | ICD-10-CM | POA: Diagnosis not present

## 2022-03-23 DIAGNOSIS — G4733 Obstructive sleep apnea (adult) (pediatric): Secondary | ICD-10-CM | POA: Diagnosis not present

## 2022-03-23 DIAGNOSIS — Z471 Aftercare following joint replacement surgery: Secondary | ICD-10-CM | POA: Diagnosis not present

## 2022-03-23 DIAGNOSIS — G4700 Insomnia, unspecified: Secondary | ICD-10-CM | POA: Diagnosis not present

## 2022-03-23 DIAGNOSIS — I10 Essential (primary) hypertension: Secondary | ICD-10-CM | POA: Diagnosis not present

## 2022-03-23 DIAGNOSIS — Z79899 Other long term (current) drug therapy: Secondary | ICD-10-CM | POA: Diagnosis not present

## 2022-03-26 ENCOUNTER — Telehealth: Payer: Self-pay | Admitting: *Deleted

## 2022-03-26 ENCOUNTER — Other Ambulatory Visit: Payer: Self-pay | Admitting: Physician Assistant

## 2022-03-26 MED ORDER — OXYCODONE-ACETAMINOPHEN 5-325 MG PO TABS: 1.0000 | ORAL_TABLET | Freq: Three times a day (TID) | ORAL | 0 refills | Status: DC | PRN

## 2022-03-26 NOTE — Telephone Encounter (Signed)
Patient will need refill of pain medication. She ran out over the weekend. Pharmacy on chart.

## 2022-03-26 NOTE — Telephone Encounter (Signed)
sent

## 2022-03-27 NOTE — Telephone Encounter (Signed)
Patient aware.

## 2022-03-30 ENCOUNTER — Telehealth: Payer: Self-pay | Admitting: Orthopaedic Surgery

## 2022-03-30 ENCOUNTER — Other Ambulatory Visit: Payer: Self-pay | Admitting: Physician Assistant

## 2022-03-30 MED ORDER — OXYCODONE-ACETAMINOPHEN 5-325 MG PO TABS: 1.0000 | ORAL_TABLET | Freq: Three times a day (TID) | ORAL | 0 refills | Status: DC | PRN

## 2022-03-30 NOTE — Telephone Encounter (Signed)
Patient requesting a refill on her medication Oxycodone

## 2022-03-30 NOTE — Telephone Encounter (Signed)
sent

## 2022-04-02 NOTE — Therapy (Unsigned)
OUTPATIENT PHYSICAL THERAPY LOWER EXTREMITY EVALUATION   Patient Name: Kimberly Murray MRN: [id number] DOB:05-06-1950, 72 y.o., female Today's Date: 04/03/2022  END OF SESSION:  PT End of Session - 04/03/22 1201     Visit Number 1    Number of Visits 28    Date for PT Re-Evaluation 06/29/22    PT Start Time 1150    PT Stop Time 1230    PT Time Calculation (min) 40 min    Activity Tolerance Patient tolerated treatment well    Behavior During Therapy WFL for tasks assessed/performed             Past Medical History:  Diagnosis Date   Allergic rhinitis    Chronic headaches    Complication of anesthesia    Depression    on antidepressent medication   Fatty liver    History of acute bronchitis    Hyperlipidemia    Hypertension    Neuropathy    in toes   PONV (postoperative nausea and vomiting)    mild nausea after gallbladder surgery   Pre-diabetes    Sleep apnea    Past Surgical History:  Procedure Laterality Date   BUNIONECTOMY Right    CATARACT EXTRACTION W/ INTRAOCULAR LENS IMPLANT Bilateral    CHOLECYSTECTOMY  2006   EYE SURGERY     macular surgery   LAPAROSCOPIC HYSTERECTOMY  2005   TOTAL KNEE ARTHROPLASTY Left 03/19/2022   Procedure: LEFT TOTAL KNEE ARTHROPLASTY;  Surgeon: Leandrew Koyanagi, MD;  Location: [facility];  Service: Orthopedics;  Laterality: Left;   Patient Active Problem List   Diagnosis Date Noted   Primary osteoarthritis of left knee 03/19/2022   Status post total left knee replacement 03/19/2022   Headache 01/28/2019   Obstructive sleep apnea 08/01/2018   Insomnia 08/01/2018   GERD (gastroesophageal reflux disease) 08/01/2018    PCP: Fanny Bien, MD   REFERRING PROVIDER: Leandrew Koyanagi, MD  REFERRING DIAG: [phone] (ICD-10-CM) - Primary osteoarthritis of left knee [phone] (ICD-10-CM) - Status post total left knee replacement  THERAPY DIAG:  Muscle weakness (generalized)  Acute pain of left knee  Localized edema  Stiffness of left  knee, not elsewhere classified  Rationale for Evaluation and Treatment: Rehabilitation  ONSET DATE: left TKA on 03/19/22  SUBJECTIVE:   SUBJECTIVE STATEMENT: Pt s/p left TKA on 03/19/22.   PERTINENT HISTORY: Left TKA on 03/19/22, fatty liver, HTN, neuropathy, depression, pre-diabetes PAIN:  NPRS scale: 7/10 Pain location: left knee Pain description: achy, tightness Aggravating factors: sitting or standing Relieving factors: ice, pain meds  PRECAUTIONS: None  WEIGHT BEARING RESTRICTIONS: No  FALLS:  Has patient fallen in last 6 months? No  LIVING ENVIRONMENT: Lives with: lives with their family and lives with their spouse Lives in: House/apartment Stairs: Yes: External: 1 steps; none Has following equipment at home: None  OCCUPATION: retired  PLOF: Independent  PATIENT GOALS: walk better, comfortably, without device  Next MD visit:   OBJECTIVE:   DIAGNOSTIC FINDINGS:  Status post left total knee arthroplasty. No periprosthetic lucency or fracture. Near anatomic alignment of the prosthetic components. Air within the soft tissues is not unexpected postoperatively.  PATIENT SURVEYS:  04/03/22: FOTO intake:  31%    COGNITION: Overall cognitive status: WFL    SENSATION: 04/03/22 WFL  EDEMA:  Circumferential: Rt:  37.5 centimeters            Left: 47 centimeters  POSTURE: rounded shoulders, forward head, and increased lumbar lordosis  PALPATION: 04/03/22: tender  around joint line  LOWER EXTREMITY ROM:   ROM Right 04/03/22 Left 04/03/22  Hip flexion    Hip extension    Hip abduction    Hip adduction    Hip internal rotation    Hip external rotation    Knee flexion A: 130 A: 85 P: 90  Knee extension A: 0 A: -10 P: -8  Ankle dorsiflexion    Ankle plantarflexion    Ankle inversion    Ankle eversion     (Blank rows = not tested)  LOWER EXTREMITY MMT:  MMT Right 04/03/22 Left 04/03/22  Hip flexion 5/5 5/5  Hip extension    Hip abduction    Hip  adduction    Hip internal rotation    Hip external rotation    Knee flexion 5/5 3/5  Knee extension 5/5 3/5  Ankle dorsiflexion 5/5 5/5   (Blank rows = not tested)    FUNCTIONAL TESTS:  04/03/22: 5 times sit to stand:   GAIT: Distance walked: 30 feet from clinic lobby to exam room Assistive device utilized: Single point cane Level of assistance: Modified independence Comments: antalgic gait pattern   TODAY'S TREATMENT                                                                          DATE: 04/03/22:  Therex:    HEP instruction/performance c cues for techniques, handout provided.  Trial set performed of each for comprehension and symptom assessment.  See below for exercise list  PATIENT EDUCATION:  Education details: HEP, POC Person educated: Patient Education method: Explanation, Demonstration, Verbal cues, and Handouts Education comprehension: verbalized understanding, returned demonstration, and verbal cues required  HOME EXERCISE PROGRAM: Access Code: [email or web address] URL: [email or web address] Date: 04/03/2022 Prepared by: Kearney Hard  Exercises - Supine Quadricep Sets  - 3-4 x daily - 7 x weekly - 2 sets - 10 reps - 5 seconds hold - Supine Heel Slide with Strap  - 3-4 x daily - 7 x weekly - 10 reps - 5-10 seconds hold - Supine Active Straight Leg Raise  - 3-4 x daily - 7 x weekly - 2 sets - 10 reps - Seated Knee Flexion AAROM  - 3-4 x daily - 7 x weekly - 10 reps - 5-10 seconds hold - Sit to Stand with Counter Support  - 3-4 x daily - 7 x weekly - 10 reps  ASSESSMENT:  CLINICAL IMPRESSION: Patient is a 73 y.o. who comes to clinic with complaints of left knee pain  s/p left TKA on 03/19/22 with mobility, strength and movement coordination deficits that impair their ability to perform usual daily and recreational functional activities without increase difficulty/symptoms at this time.  Patient to benefit from skilled PT services to address impairments  and limitations to improve to previous level of function without restriction secondary to condition.   OBJECTIVE IMPAIRMENTS: decreased activity tolerance, decreased balance, decreased mobility, difficulty walking, decreased ROM, decreased strength, increased edema, and pain.   ACTIVITY LIMITATIONS: bending, sitting, standing, squatting, sleeping, stairs, and transfers  PARTICIPATION LIMITATIONS: cleaning, laundry, and community activity  PERSONAL FACTORS: 3+ comorbidities: see above  are also affecting patient's functional outcome.   REHAB POTENTIAL: Good  CLINICAL DECISION MAKING: Stable/uncomplicated  EVALUATION COMPLEXITY: Low   GOALS: Goals reviewed with patient? Yes  SHORT TERM GOALS: (target date for Short term goals are 3 weeks 04/27/22)   1.  Patient will demonstrate independent use of home exercise program to maintain progress from in clinic treatments.  Goal status: New  LONG TERM GOALS: (target dates for all long term goals are 12 weeks  06/29/22)   1. Patient will demonstrate/report pain at worst less than or equal to 2/10 to facilitate minimal limitation in daily activity secondary to pain symptoms.  Goal status: New   2. Patient will demonstrate independent use of home exercise program to facilitate ability to maintain/progress functional gains from skilled physical therapy services.  Goal status: New   3. Patient will demonstrate FOTO outcome > or = 57 % to indicate reduced disability due to condition.  Goal status: New   4.  Patient will demonstrate Left LE MMT 5/5 throughout to improve pt's functional mobility.   Goal status: New   5.  Patient will demonstrate ability to navigate 1 flight of stairs with single hand rail with step over step gait pattern.  Goal status: New   6.  Pt will be able to perform sit to stand with no UE support from standard height chair.  Goal status: New  7. Pt will improve her left knee active arc of motion from 2 to 115  degrees in order to improve functional mobility.   Goal Status:        PLAN:  PT FREQUENCY: 2-3x/week  PT DURATION: 12 weeks  PLANNED INTERVENTIONS: Therapeutic exercises, Therapeutic activity, Neuro Muscular re-education, Balance training, Gait training, Patient/Family education, Joint mobilization, Stair training, DME instructions, Dry Needling, Electrical stimulation, Traction, Cryotherapy, vasopneumatic deviceMoist heat, Taping, Ultrasound, Ionotophoresis 3m/ml Dexamethasone, and Manual therapy.  All included unless contraindicated  PLAN FOR NEXT SESSION: Review HEP knowledge/results, Manual, LE strengthening, Nustep, Manual      JOretha Caprice PT, MPT 04/03/2022, 12:42 PM

## 2022-04-03 ENCOUNTER — Encounter: Payer: Self-pay | Admitting: Physical Therapy

## 2022-04-03 ENCOUNTER — Other Ambulatory Visit: Payer: Self-pay

## 2022-04-03 ENCOUNTER — Ambulatory Visit: Payer: PPO | Admitting: Physical Therapy

## 2022-04-03 ENCOUNTER — Ambulatory Visit (INDEPENDENT_AMBULATORY_CARE_PROVIDER_SITE_OTHER): Payer: PPO | Admitting: Orthopaedic Surgery

## 2022-04-03 DIAGNOSIS — M25662 Stiffness of left knee, not elsewhere classified: Secondary | ICD-10-CM

## 2022-04-03 DIAGNOSIS — Z96652 Presence of left artificial knee joint: Secondary | ICD-10-CM

## 2022-04-03 DIAGNOSIS — M6281 Muscle weakness (generalized): Secondary | ICD-10-CM

## 2022-04-03 DIAGNOSIS — R600 Localized edema: Secondary | ICD-10-CM

## 2022-04-03 DIAGNOSIS — M25562 Pain in left knee: Secondary | ICD-10-CM

## 2022-04-03 MED ORDER — OXYCODONE-ACETAMINOPHEN 5-325 MG PO TABS: 1.0000 | ORAL_TABLET | Freq: Two times a day (BID) | ORAL | 0 refills | Status: AC | PRN

## 2022-04-03 MED ORDER — IBUPROFEN 800 MG PO TABS: 800.0000 mg | ORAL_TABLET | Freq: Three times a day (TID) | ORAL | 2 refills | Status: AC | PRN

## 2022-04-03 NOTE — Progress Notes (Signed)
   Post-Op Visit Note   Patient: Kimberly Murray           Date of Birth: 1951/02/04           MRN: [id number] Visit Date: 04/03/2022 PCP: Fanny Bien, MD   Assessment & Plan:  Chief Complaint:  Chief Complaint  Patient presents with   Left Knee - Routine Post Op   Visit Diagnoses:  1. Status post total left knee replacement     Plan: Dakoda returns today 2 weeks status post left total knee replacement on 03/19/2022.  She is doing as well as expected.  Examination of the left knee shows healed surgical incision.  Expected postoperative swelling.  Range of motion is progressing well.  Negative Homans' sign and no calf tenderness.  Percocet and Advil prescribed.  She will begin outpatient PT today.  Instructions reviewed.  Sutures and Steri-Strips applied.  Recheck in 4 weeks with two-view x-rays of the left knee.  Follow-Up Instructions: Return in about 4 weeks (around 05/01/2022).   Orders:  No orders of the defined types were placed in this encounter.  Meds ordered this encounter  Medications   ibuprofen (ADVIL) 800 MG tablet    Sig: Take 1 tablet (800 mg total) by mouth every 8 (eight) hours as needed.    Dispense:  30 tablet    Refill:  2   oxyCODONE-acetaminophen (PERCOCET) 5-325 MG tablet    Sig: Take 1-2 tablets by mouth 2 (two) times daily as needed for severe pain.    Dispense:  30 tablet    Refill:  0    Imaging: No results found.  PMFS History: Patient Active Problem List   Diagnosis Date Noted   Primary osteoarthritis of left knee 03/19/2022   Status post total left knee replacement 03/19/2022   Headache 01/28/2019   Obstructive sleep apnea 08/01/2018   Insomnia 08/01/2018   GERD (gastroesophageal reflux disease) 08/01/2018   Past Medical History:  Diagnosis Date   Allergic rhinitis    Chronic headaches    Complication of anesthesia    Depression    on antidepressent medication   Fatty liver    History of acute bronchitis    Hyperlipidemia     Hypertension    Neuropathy    in toes   PONV (postoperative nausea and vomiting)    mild nausea after gallbladder surgery   Pre-diabetes    Sleep apnea     No family history on file.  Past Surgical History:  Procedure Laterality Date   BUNIONECTOMY Right    CATARACT EXTRACTION W/ INTRAOCULAR LENS IMPLANT Bilateral    CHOLECYSTECTOMY  2006   EYE SURGERY     macular surgery   LAPAROSCOPIC HYSTERECTOMY  2005   TOTAL KNEE ARTHROPLASTY Left 03/19/2022   Procedure: LEFT TOTAL KNEE ARTHROPLASTY;  Surgeon: Leandrew Koyanagi, MD;  Location: [facility];  Service: Orthopedics;  Laterality: Left;   Social History   Occupational History   Not on file  Tobacco Use   Smoking status: Never   Smokeless tobacco: Never  Vaping Use   Vaping Use: Never used  Substance and Sexual Activity   Alcohol use: Yes    Comment: once every couple of months   Drug use: Yes    Types: Marijuana    Comment: THC gummies- occasionally   Sexual activity: Not Currently

## 2022-04-04 ENCOUNTER — Encounter: Payer: Self-pay | Admitting: Physical Therapy

## 2022-04-04 ENCOUNTER — Telehealth: Payer: Self-pay | Admitting: *Deleted

## 2022-04-04 ENCOUNTER — Ambulatory Visit: Payer: PPO | Admitting: Physical Therapy

## 2022-04-04 DIAGNOSIS — R600 Localized edema: Secondary | ICD-10-CM

## 2022-04-04 DIAGNOSIS — M6281 Muscle weakness (generalized): Secondary | ICD-10-CM | POA: Diagnosis not present

## 2022-04-04 DIAGNOSIS — M25662 Stiffness of left knee, not elsewhere classified: Secondary | ICD-10-CM | POA: Diagnosis not present

## 2022-04-04 DIAGNOSIS — M25562 Pain in left knee: Secondary | ICD-10-CM | POA: Diagnosis not present

## 2022-04-04 NOTE — Telephone Encounter (Signed)
Ortho bundle 14 day in office meeting completed. °

## 2022-04-04 NOTE — Therapy (Unsigned)
OUTPATIENT PHYSICAL THERAPY LOWER EXTREMITY EVALUATION   Patient Name: Kimberly Murray MRN: [id number] DOB:19-Apr-1950, 72 y.o., female Today's Date: 04/04/2022  END OF SESSION:  PT End of Session - 04/04/22 1514     Visit Number 2    Number of Visits 28    Date for PT Re-Evaluation 06/29/22    PT Start Time [id number]    PT Stop Time 1600    PT Time Calculation (min) 44 min    Activity Tolerance Patient tolerated treatment well    Behavior During Therapy WFL for tasks assessed/performed             Past Medical History:  Diagnosis Date   Allergic rhinitis    Chronic headaches    Complication of anesthesia    Depression    on antidepressent medication   Fatty liver    History of acute bronchitis    Hyperlipidemia    Hypertension    Neuropathy    in toes   PONV (postoperative nausea and vomiting)    mild nausea after gallbladder surgery   Pre-diabetes    Sleep apnea    Past Surgical History:  Procedure Laterality Date   BUNIONECTOMY Right    CATARACT EXTRACTION W/ INTRAOCULAR LENS IMPLANT Bilateral    CHOLECYSTECTOMY  2006   EYE SURGERY     macular surgery   LAPAROSCOPIC HYSTERECTOMY  2005   TOTAL KNEE ARTHROPLASTY Left 03/19/2022   Procedure: LEFT TOTAL KNEE ARTHROPLASTY;  Surgeon: Leandrew Koyanagi, MD;  Location: [facility];  Service: Orthopedics;  Laterality: Left;   Patient Active Problem List   Diagnosis Date Noted   Primary osteoarthritis of left knee 03/19/2022   Status post total left knee replacement 03/19/2022   Headache 01/28/2019   Obstructive sleep apnea 08/01/2018   Insomnia 08/01/2018   GERD (gastroesophageal reflux disease) 08/01/2018    PCP: Fanny Bien, MD   REFERRING PROVIDER: Leandrew Koyanagi, MD  REFERRING DIAG: [phone] (ICD-10-CM) - Primary osteoarthritis of left knee [phone] (ICD-10-CM) - Status post total left knee replacement  THERAPY DIAG:  Acute pain of left knee  Muscle weakness (generalized)  Localized edema  Stiffness of left  knee, not elsewhere classified  Rationale for Evaluation and Treatment: Rehabilitation  ONSET DATE: left TKA on 03/19/22  SUBJECTIVE:   SUBJECTIVE STATEMENT: She reports she is doing okay today, slightly better than yesterday when she came in for her eval.   PERTINENT HISTORY: Left TKA on 03/19/22, fatty liver, HTN, neuropathy, depression, pre-diabetes PAIN:  NPRS scale: 5-6/10 Pain location: left knee Pain description: achy, tightness Aggravating factors: sitting or standing Relieving factors: ice, pain meds  PRECAUTIONS: None  WEIGHT BEARING RESTRICTIONS: No  FALLS:  Has patient fallen in last 6 months? No  LIVING ENVIRONMENT: Lives with: lives with their family and lives with their spouse Lives in: House/apartment Stairs: Yes: External: 1 steps; none Has following equipment at home: None  OCCUPATION: retired  PLOF: Independent  PATIENT GOALS: walk better, comfortably, without device  Next MD visit:   OBJECTIVE:   DIAGNOSTIC FINDINGS:  Status post left total knee arthroplasty. No periprosthetic lucency or fracture. Near anatomic alignment of the prosthetic components. Air within the soft tissues is not unexpected postoperatively.  PATIENT SURVEYS:  04/03/22: FOTO intake:  31%    COGNITION: Overall cognitive status: WFL    SENSATION: 04/03/22 WFL  EDEMA:  Circumferential: Rt:  37.5 centimeters            Left: 47 centimeters  POSTURE:  rounded shoulders, forward head, and increased lumbar lordosis  PALPATION: 04/03/22: tender around joint line  LOWER EXTREMITY ROM:   ROM Right 04/03/22 Left 04/03/22  Hip flexion    Hip extension    Hip abduction    Hip adduction    Hip internal rotation    Hip external rotation    Knee flexion A: 130 A: 85 P: 90  Knee extension A: 0 A: -10 P: -8  Ankle dorsiflexion    Ankle plantarflexion    Ankle inversion    Ankle eversion     (Blank rows = not tested)  LOWER EXTREMITY MMT:  MMT Right 04/03/22  Left 04/03/22  Hip flexion 5/5 5/5  Hip extension    Hip abduction    Hip adduction    Hip internal rotation    Hip external rotation    Knee flexion 5/5 3/5  Knee extension 5/5 3/5  Ankle dorsiflexion 5/5 5/5   (Blank rows = not tested)    FUNCTIONAL TESTS:  04/03/22: 5 times sit to stand:   GAIT: Distance walked: 30 feet from clinic lobby to exam room Assistive device utilized: Single point cane Level of assistance: Modified independence Comments: antalgic gait pattern   TODAY'S TREATMENT                                                                          DATE: 04/04/22: -Nu Step Seat 9 level 2 UE/LE x36mn  -Supine quad sets x10 hold for 5 sec  -Supine heel slide x10 hold for 5 sec -Seated knee flexion AA x10 hold for 5 sec -Sit to stands raised mat table x5 with UE support  -Seated LAQ 2x10  Manual therapy -knee extension with overpressure 2x10 -knee flexion with overpressure 2x10  -Vasopnuematic device X 10 min, medium compression, 34 deg to left knee  04/03/22:  Therex:    HEP instruction/performance c cues for techniques, handout provided.  Trial set performed of each for comprehension and symptom assessment.  See below for exercise list  PATIENT EDUCATION:  Education details: HEP, POC Person educated: Patient Education method: Explanation, Demonstration, Verbal cues, and Handouts Education comprehension: verbalized understanding, returned demonstration, and verbal cues required  HOME EXERCISE PROGRAM: Access Code: [email or web address] URL: [email or web address] Date: 04/03/2022 Prepared by: JKearney Hard Exercises - Supine Quadricep Sets  - 3-4 x daily - 7 x weekly - 2 sets - 10 reps - 5 seconds hold - Supine Heel Slide with Strap  - 3-4 x daily - 7 x weekly - 10 reps - 5-10 seconds hold - Supine Active Straight Leg Raise  - 3-4 x daily - 7 x weekly - 2 sets - 10 reps - Seated Knee Flexion AAROM  - 3-4 x daily - 7 x weekly - 10 reps - 5-10  seconds hold - Sit to Stand with Counter Support  - 3-4 x daily - 7 x weekly - 10 reps  ASSESSMENT:  CLINICAL IMPRESSION: She tolerated treatment fair today. She was limited by pain with some exercises so we did not do as many sets today. She just had her eval yesterday and was unable to really begin HEP, but said she will start working on the exercises at home before next  visit. She will benefit from skilled PT to address limitations in knee ROM, strength, and balance to improve functionally.  OBJECTIVE IMPAIRMENTS: decreased activity tolerance, decreased balance, decreased mobility, difficulty walking, decreased ROM, decreased strength, increased edema, and pain.   ACTIVITY LIMITATIONS: bending, sitting, standing, squatting, sleeping, stairs, and transfers  PARTICIPATION LIMITATIONS: cleaning, laundry, and community activity  PERSONAL FACTORS: 3+ comorbidities: see above  are also affecting patient's functional outcome.   REHAB POTENTIAL: Good  CLINICAL DECISION MAKING: Stable/uncomplicated  EVALUATION COMPLEXITY: Low   GOALS: Goals reviewed with patient? Yes  SHORT TERM GOALS: (target date for Short term goals are 3 weeks 04/27/22)   1.  Patient will demonstrate independent use of home exercise program to maintain progress from in clinic treatments.  Goal status: New  LONG TERM GOALS: (target dates for all long term goals are 12 weeks  06/29/22)   1. Patient will demonstrate/report pain at worst less than or equal to 2/10 to facilitate minimal limitation in daily activity secondary to pain symptoms.  Goal status: New   2. Patient will demonstrate independent use of home exercise program to facilitate ability to maintain/progress functional gains from skilled physical therapy services.  Goal status: New   3. Patient will demonstrate FOTO outcome > or = 57 % to indicate reduced disability due to condition.  Goal status: New   4.  Patient will demonstrate Left LE MMT 5/5  throughout to improve pt's functional mobility.   Goal status: New   5.  Patient will demonstrate ability to navigate 1 flight of stairs with single hand rail with step over step gait pattern.  Goal status: New   6.  Pt will be able to perform sit to stand with no UE support from standard height chair.  Goal status: New  7. Pt will improve her left knee active arc of motion from 2 to 115 degrees in order to improve functional mobility.   Goal Status:        PLAN:  PT FREQUENCY: 2-3x/week  PT DURATION: 12 weeks  PLANNED INTERVENTIONS: Therapeutic exercises, Therapeutic activity, Neuro Muscular re-education, Balance training, Gait training, Patient/Family education, Joint mobilization, Stair training, DME instructions, Dry Needling, Electrical stimulation, Traction, Cryotherapy, vasopneumatic deviceMoist heat, Taping, Ultrasound, Ionotophoresis 53m/ml Dexamethasone, and Manual therapy.  All included unless contraindicated  PLAN FOR NEXT SESSION: Review HEP, Manual, LE strengthening, Nustep, vaso at end    Artist Bloom, Student-PT 04/04/2022, 4:10 PM

## 2022-04-10 ENCOUNTER — Ambulatory Visit: Payer: PPO | Admitting: Physical Therapy

## 2022-04-10 ENCOUNTER — Encounter: Payer: Self-pay | Admitting: Physical Therapy

## 2022-04-10 DIAGNOSIS — M6281 Muscle weakness (generalized): Secondary | ICD-10-CM | POA: Diagnosis not present

## 2022-04-10 DIAGNOSIS — M25662 Stiffness of left knee, not elsewhere classified: Secondary | ICD-10-CM | POA: Diagnosis not present

## 2022-04-10 DIAGNOSIS — M25562 Pain in left knee: Secondary | ICD-10-CM

## 2022-04-10 DIAGNOSIS — R600 Localized edema: Secondary | ICD-10-CM

## 2022-04-10 NOTE — Therapy (Signed)
OUTPATIENT PHYSICAL THERAPY LOWER EXTREMITY EVALUATION   Patient Name: Kimberly Murray MRN: [id number] DOB:10-29-1950, 72 y.o., female Today's Date: 04/10/2022  END OF SESSION:  PT End of Session - 04/10/22 1155     Visit Number 3    Number of Visits 28    Date for PT Re-Evaluation 06/29/22    PT Start Time 1149    PT Stop Time 1234    PT Time Calculation (min) 45 min    Activity Tolerance Patient tolerated treatment well    Behavior During Therapy WFL for tasks assessed/performed             Past Medical History:  Diagnosis Date   Allergic rhinitis    Chronic headaches    Complication of anesthesia    Depression    on antidepressent medication   Fatty liver    History of acute bronchitis    Hyperlipidemia    Hypertension    Neuropathy    in toes   PONV (postoperative nausea and vomiting)    mild nausea after gallbladder surgery   Pre-diabetes    Sleep apnea    Past Surgical History:  Procedure Laterality Date   BUNIONECTOMY Right    CATARACT EXTRACTION W/ INTRAOCULAR LENS IMPLANT Bilateral    CHOLECYSTECTOMY  2006   EYE SURGERY     macular surgery   LAPAROSCOPIC HYSTERECTOMY  2005   TOTAL KNEE ARTHROPLASTY Left 03/19/2022   Procedure: LEFT TOTAL KNEE ARTHROPLASTY;  Surgeon: Leandrew Koyanagi, MD;  Location: [facility];  Service: Orthopedics;  Laterality: Left;   Patient Active Problem List   Diagnosis Date Noted   Primary osteoarthritis of left knee 03/19/2022   Status post total left knee replacement 03/19/2022   Headache 01/28/2019   Obstructive sleep apnea 08/01/2018   Insomnia 08/01/2018   GERD (gastroesophageal reflux disease) 08/01/2018    PCP: Fanny Bien, MD   REFERRING PROVIDER: Leandrew Koyanagi, MD  REFERRING DIAG: [phone] (ICD-10-CM) - Primary osteoarthritis of left knee [phone] (ICD-10-CM) - Status post total left knee replacement  THERAPY DIAG:  Acute pain of left knee  Muscle weakness (generalized)  Localized edema  Stiffness of left  knee, not elsewhere classified  Rationale for Evaluation and Treatment: Rehabilitation  ONSET DATE: left TKA on 03/19/22  SUBJECTIVE:   SUBJECTIVE STATEMENT:  Pt reporting 5-6/10 pain in her left knee. Pt reporting having difficulty with her CPM this morning at 80 degrees.   PERTINENT HISTORY: Left TKA on 03/19/22, fatty liver, HTN, neuropathy, depression, pre-diabetes PAIN:  NPRS scale: 5-6/10 Pain location: left knee Pain description: achy, tightness Aggravating factors: sitting or standing Relieving factors: ice, pain meds  PRECAUTIONS: None  WEIGHT BEARING RESTRICTIONS: No  FALLS:  Has patient fallen in last 6 months? No  LIVING ENVIRONMENT: Lives with: lives with their family and lives with their spouse Lives in: House/apartment Stairs: Yes: External: 1 steps; none Has following equipment at home: None  OCCUPATION: retired  PLOF: Independent  PATIENT GOALS: walk better, comfortably, without device  Next MD visit:   OBJECTIVE:   DIAGNOSTIC FINDINGS:  Status post left total knee arthroplasty. No periprosthetic lucency or fracture. Near anatomic alignment of the prosthetic components. Air within the soft tissues is not unexpected postoperatively.  PATIENT SURVEYS:  04/03/22: FOTO intake:  31%    COGNITION: Overall cognitive status: WFL    SENSATION: 04/03/22 WFL  EDEMA:  Circumferential: Rt:  37.5 centimeters            Left: 47  centimeters  POSTURE: rounded shoulders, forward head, and increased lumbar lordosis  PALPATION: 04/03/22: tender around joint line  LOWER EXTREMITY ROM:   ROM Right 04/03/22 Left 04/03/22 Left 04/10/22  Hip flexion     Hip extension     Hip abduction     Hip adduction     Hip internal rotation     Hip external rotation     Knee flexion A: 130 A: 85 P: 90 A: 88 P: 94  Knee extension A: 0 A: -10 P: -8 A: -8 P: -6  Ankle dorsiflexion     Ankle plantarflexion     Ankle inversion     Ankle eversion      (Blank  rows = not tested)  LOWER EXTREMITY MMT:  MMT Right 04/03/22 Left 04/03/22  Hip flexion 5/5 5/5  Hip extension    Hip abduction    Hip adduction    Hip internal rotation    Hip external rotation    Knee flexion 5/5 3/5  Knee extension 5/5 3/5  Ankle dorsiflexion 5/5 5/5   (Blank rows = not tested)    FUNCTIONAL TESTS:  04/03/22: 5 times sit to stand:   GAIT: Distance walked: 30 feet from clinic lobby to exam room Assistive device utilized: Single point cane Level of assistance: Modified independence Comments: antalgic gait pattern   TODAY'S TREATMENT                                                                          DATE: 04/10/22:  TherEx:  Nustep: seat 9 Level 5 x 7 minutes Standing calf raises: x 15 c UE support Standing hip abd x 15 each LE c UE support Sit to stand x 10 UE support Seated SLR: x 10  Seated hamstring stretch x 3 holding 30 seconds Supine SLR: x 10 each LE Manual: PROM: left knee flexion and extension with overpressure Modalities Vasopneumatic: 34 degrees, medium compression x 10 minutes  04/04/22: -Nu Step Seat 9 level 2 UE/LE x58mn  -Supine quad sets x10 hold for 5 sec  -Supine heel slide x10 hold for 5 sec -Seated knee flexion AA x10 hold for 5 sec -Sit to stands raised mat table x5 with UE support  -Seated LAQ 2x10  Manual therapy -knee extension with overpressure 2x10 -knee flexion with overpressure 2x10  -Vasopnuematic device X 10 min, medium compression, 34 deg to left knee  04/03/22:  Therex:    HEP instruction/performance c cues for techniques, handout provided.  Trial set performed of each for comprehension and symptom assessment.  See below for exercise list  PATIENT EDUCATION:  Education details: HEP, POC Person educated: Patient Education method: Explanation, Demonstration, Verbal cues, and Handouts Education comprehension: verbalized understanding, returned demonstration, and verbal cues required  HOME EXERCISE  PROGRAM: Access Code: [email or web address] URL: [email or web address] Date: 04/03/2022 Prepared by: JKearney Hard Exercises - Supine Quadricep Sets  - 3-4 x daily - 7 x weekly - 2 sets - 10 reps - 5 seconds hold - Supine Heel Slide with Strap  - 3-4 x daily - 7 x weekly - 10 reps - 5-10 seconds hold - Supine Active Straight Leg Raise  - 3-4 x daily - 7 x weekly -  2 sets - 10 reps - Seated Knee Flexion AAROM  - 3-4 x daily - 7 x weekly - 10 reps - 5-10 seconds hold - Sit to Stand with Counter Support  - 3-4 x daily - 7 x weekly - 10 reps  ASSESSMENT:  CLINICAL IMPRESSION: Pt with some improvements in her knee active ROM. Pt still reporting difficulty with sleeping. Pt stating she is still up and down throughout the night due to pain. Treatment focusing on LE ROM and strengthening.   OBJECTIVE IMPAIRMENTS: decreased activity tolerance, decreased balance, decreased mobility, difficulty walking, decreased ROM, decreased strength, increased edema, and pain.   ACTIVITY LIMITATIONS: bending, sitting, standing, squatting, sleeping, stairs, and transfers  PARTICIPATION LIMITATIONS: cleaning, laundry, and community activity  PERSONAL FACTORS: 3+ comorbidities: see above  are also affecting patient's functional outcome.   REHAB POTENTIAL: Good  CLINICAL DECISION MAKING: Stable/uncomplicated  EVALUATION COMPLEXITY: Low   GOALS: Goals reviewed with patient? Yes  SHORT TERM GOALS: (target date for Short term goals are 3 weeks 04/27/22)   1.  Patient will demonstrate independent use of home exercise program to maintain progress from in clinic treatments.  Goal status: On-going 04/10/22  LONG TERM GOALS: (target dates for all long term goals are 12 weeks  06/29/22)   1. Patient will demonstrate/report pain at worst less than or equal to 2/10 to facilitate minimal limitation in daily activity secondary to pain symptoms.  Goal status: New   2. Patient will demonstrate independent use  of home exercise program to facilitate ability to maintain/progress functional gains from skilled physical therapy services.  Goal status: New   3. Patient will demonstrate FOTO outcome > or = 57 % to indicate reduced disability due to condition.  Goal status: New   4.  Patient will demonstrate Left LE MMT 5/5 throughout to improve pt's functional mobility.   Goal status: New   5.  Patient will demonstrate ability to navigate 1 flight of stairs with single hand rail with step over step gait pattern.  Goal status: New   6.  Pt will be able to perform sit to stand with no UE support from standard height chair.  Goal status: New  7. Pt will improve her left knee active arc of motion from 2 to 115 degrees in order to improve functional mobility.   Goal Status:        PLAN:  PT FREQUENCY: 2-3x/week  PT DURATION: 12 weeks  PLANNED INTERVENTIONS: Therapeutic exercises, Therapeutic activity, Neuro Muscular re-education, Balance training, Gait training, Patient/Family education, Joint mobilization, Stair training, DME instructions, Dry Needling, Electrical stimulation, Traction, Cryotherapy, vasopneumatic deviceMoist heat, Taping, Ultrasound, Ionotophoresis 72m/ml Dexamethasone, and Manual therapy.  All included unless contraindicated  PLAN FOR NEXT SESSION: Review HEP, Manual, LE strengthening, Nustep, vaso at end    JOretha Caprice PT, MPT 04/10/2022, 11:57 AM

## 2022-04-11 ENCOUNTER — Other Ambulatory Visit: Payer: Self-pay | Admitting: Physician Assistant

## 2022-04-11 ENCOUNTER — Telehealth: Payer: Self-pay | Admitting: *Deleted

## 2022-04-11 MED ORDER — OXYCODONE-ACETAMINOPHEN 5-325 MG PO TABS: 1.0000 | ORAL_TABLET | Freq: Four times a day (QID) | ORAL | 0 refills | Status: AC | PRN

## 2022-04-11 NOTE — Telephone Encounter (Signed)
Patient aware.

## 2022-04-11 NOTE — Telephone Encounter (Signed)
Sent in for 1-2 every 6 hours for now and will just try to wean again on next rx

## 2022-04-11 NOTE — Telephone Encounter (Signed)
Patient called requesting refill of pain medication. Pharmacy on chart. Kimberly Murray is taking 2 tablets at a time every 6 hours and I explained Kimberly Murray really needs to think of how to decrease this amount at this point and utilize her muscle relaxer as well as Ibuprofen that was prescribed by Dr. Erlinda Hong. Thanks.

## 2022-04-12 ENCOUNTER — Ambulatory Visit: Payer: PPO | Admitting: Physical Therapy

## 2022-04-12 ENCOUNTER — Encounter: Payer: Self-pay | Admitting: Physical Therapy

## 2022-04-12 DIAGNOSIS — M25662 Stiffness of left knee, not elsewhere classified: Secondary | ICD-10-CM | POA: Diagnosis not present

## 2022-04-12 DIAGNOSIS — M25562 Pain in left knee: Secondary | ICD-10-CM | POA: Diagnosis not present

## 2022-04-12 DIAGNOSIS — M6281 Muscle weakness (generalized): Secondary | ICD-10-CM

## 2022-04-12 DIAGNOSIS — R600 Localized edema: Secondary | ICD-10-CM | POA: Diagnosis not present

## 2022-04-12 NOTE — Therapy (Signed)
OUTPATIENT PHYSICAL THERAPY LOWER EXTREMITY EVALUATION   Patient Name: Kimberly Murray MRN: [id number] DOB:19-Jun-1950, 72 y.o., female Today's Date: 04/12/2022  END OF SESSION:  PT End of Session - 04/12/22 1151     Visit Number 4    Number of Visits 28    Date for PT Re-Evaluation 06/29/22    PT Start Time 1130    PT Stop Time 1219    PT Time Calculation (min) 49 min    Activity Tolerance Patient tolerated treatment well    Behavior During Therapy WFL for tasks assessed/performed              Past Medical History:  Diagnosis Date   Allergic rhinitis    Chronic headaches    Complication of anesthesia    Depression    on antidepressent medication   Fatty liver    History of acute bronchitis    Hyperlipidemia    Hypertension    Neuropathy    in toes   PONV (postoperative nausea and vomiting)    mild nausea after gallbladder surgery   Pre-diabetes    Sleep apnea    Past Surgical History:  Procedure Laterality Date   BUNIONECTOMY Right    CATARACT EXTRACTION W/ INTRAOCULAR LENS IMPLANT Bilateral    CHOLECYSTECTOMY  2006   EYE SURGERY     macular surgery   LAPAROSCOPIC HYSTERECTOMY  2005   TOTAL KNEE ARTHROPLASTY Left 03/19/2022   Procedure: LEFT TOTAL KNEE ARTHROPLASTY;  Surgeon: Leandrew Koyanagi, MD;  Location: [place];  Service: Orthopedics;  Laterality: Left;   Patient Active Problem List   Diagnosis Date Noted   Primary osteoarthritis of left knee 03/19/2022   Status post total left knee replacement 03/19/2022   Headache 01/28/2019   Obstructive sleep apnea 08/01/2018   Insomnia 08/01/2018   GERD (gastroesophageal reflux disease) 08/01/2018    PCP: Fanny Bien, MD   REFERRING PROVIDER: Leandrew Koyanagi, MD  REFERRING DIAG: [phone] (ICD-10-CM) - Primary osteoarthritis of left knee [phone] (ICD-10-CM) - Status post total left knee replacement  THERAPY DIAG:  Acute pain of left knee  Muscle weakness (generalized)  Localized edema  Stiffness of left  knee, not elsewhere classified  Rationale for Evaluation and Treatment: Rehabilitation  ONSET DATE: left TKA on 03/19/22  SUBJECTIVE:   SUBJECTIVE STATEMENT:  Doing okay, knee is steadily getting better   PERTINENT HISTORY: Left TKA on 03/19/22, fatty liver, HTN, neuropathy, depression, pre-diabetes  PAIN:  NPRS scale: 4/10 Pain location: left knee Pain description: achy, tightness Aggravating factors: sitting or standing Relieving factors: ice, pain meds  PRECAUTIONS: None  WEIGHT BEARING RESTRICTIONS: No  FALLS:  Has patient fallen in last 6 months? No  LIVING ENVIRONMENT: Lives with: lives with their family and lives with their spouse Lives in: House/apartment Stairs: Yes: External: 1 steps; none Has following equipment at home: None  OCCUPATION: retired  PLOF: Independent  PATIENT GOALS: walk better, comfortably, without device   OBJECTIVE:   DIAGNOSTIC FINDINGS:  Status post left total knee arthroplasty. No periprosthetic lucency or fracture. Near anatomic alignment of the prosthetic components. Air within the soft tissues is not unexpected postoperatively.  PATIENT SURVEYS:  04/03/22: FOTO intake:  31%    COGNITION: Overall cognitive status: WFL    SENSATION: 04/03/22 WFL  EDEMA:  Circumferential: Rt:  37.5 centimeters            Left: 47 centimeters  POSTURE: rounded shoulders, forward head, and increased lumbar lordosis  PALPATION: 04/03/22: tender  around joint line  LOWER EXTREMITY ROM:   ROM Right 04/03/22 Left 04/03/22 Left 04/10/22 Left 04/12/22  Knee flexion A: 130 A: 85 P: 90 A: 88 P: 94 AA: 101  Knee extension A: 0 A: -10 P: -8 A: -8 P: -6    (Blank rows = not tested)  LOWER EXTREMITY MMT:  MMT Right 04/03/22 Left 04/03/22  Hip flexion 5/5 5/5  Hip extension    Hip abduction    Hip adduction    Hip internal rotation    Hip external rotation    Knee flexion 5/5 3/5  Knee extension 5/5 3/5  Ankle dorsiflexion 5/5 5/5    (Blank rows = not tested)    FUNCTIONAL TESTS:  04/03/22: 5 times sit to stand:   GAIT: Distance walked: 30 feet from clinic lobby to exam room Assistive device utilized: Single point cane Level of assistance: Modified independence Comments: antalgic gait pattern   TODAY'S TREATMENT                                                                          DATE: 04/12/22 TherEx:  Nustep: seat 9 Level 5 x 8 minutes Calf raises x 20 reps Standing hip abduction with L3 band x 10 reps bil Standing hip extension with L3 band x 10 reps bil Seated AA knee flexion 10 x 10 sec hold (RLE providing assist) AA heel slides with strap x 10 reps Quad sets x 10 reps - mod cues needed with difficulty activating quad SAQ with 3 sec hold x 15 reps  Manual: PROM: left knee flexion and extension with overpressure  Modalities: Vasopneumatic: 34 degrees, medium compression x 10 minutes  04/10/22:  TherEx:  Nustep: seat 9 Level 5 x 7 minutes Standing calf raises: x 15 c UE support Standing hip abd x 15 each LE c UE support Sit to stand x 10 UE support Seated SLR: x 10  Seated hamstring stretch x 3 holding 30 seconds Supine SLR: x 10 each LE  Manual: PROM: left knee flexion and extension with overpressure  Modalities Vasopneumatic: 34 degrees, medium compression x 10 minutes  04/04/22: -Nu Step Seat 9 level 2 UE/LE x60mn  -Supine quad sets x10 hold for 5 sec  -Supine heel slide x10 hold for 5 sec -Seated knee flexion AA x10 hold for 5 sec -Sit to stands raised mat table x5 with UE support  -Seated LAQ 2x10  Manual therapy -knee extension with overpressure 2x10 -knee flexion with overpressure 2x10  -Vasopnuematic device X 10 min, medium compression, 34 deg to left knee  04/03/22:  Therex:    HEP instruction/performance c cues for techniques, handout provided.  Trial set performed of each for comprehension and symptom assessment.  See below for exercise list  PATIENT EDUCATION:   Education details: HEP, POC Person educated: Patient Education method: Explanation, Demonstration, Verbal cues, and Handouts Education comprehension: verbalized understanding, returned demonstration, and verbal cues required  HOME EXERCISE PROGRAM: Access Code: [email or web address] URL: [email or web address] Date: 04/03/2022 Prepared by: JKearney Hard Exercises - Supine Quadricep Sets  - 3-4 x daily - 7 x weekly - 2 sets - 10 reps - 5 seconds hold - Supine Heel Slide with Strap  - 3-4 x  daily - 7 x weekly - 10 reps - 5-10 seconds hold - Supine Active Straight Leg Raise  - 3-4 x daily - 7 x weekly - 2 sets - 10 reps - Seated Knee Flexion AAROM  - 3-4 x daily - 7 x weekly - 10 reps - 5-10 seconds hold - Sit to Stand with Counter Support  - 3-4 x daily - 7 x weekly - 10 reps  ASSESSMENT:  CLINICAL IMPRESSION: Pt tolerated session well today with increase in flexion noted today.  Will continue to benefit from PT to maximize function.  OBJECTIVE IMPAIRMENTS: decreased activity tolerance, decreased balance, decreased mobility, difficulty walking, decreased ROM, decreased strength, increased edema, and pain.   ACTIVITY LIMITATIONS: bending, sitting, standing, squatting, sleeping, stairs, and transfers  PARTICIPATION LIMITATIONS: cleaning, laundry, and community activity  PERSONAL FACTORS: 3+ comorbidities: see above  are also affecting patient's functional outcome.   REHAB POTENTIAL: Good  CLINICAL DECISION MAKING: Stable/uncomplicated  EVALUATION COMPLEXITY: Low   GOALS: Goals reviewed with patient? Yes  SHORT TERM GOALS: (target date for Short term goals are 3 weeks 04/27/22)   1.  Patient will demonstrate independent use of home exercise program to maintain progress from in clinic treatments.  Goal status: On-going 04/10/22  LONG TERM GOALS: (target dates for all long term goals are 12 weeks  06/29/22)   1. Patient will demonstrate/report pain at worst less than or  equal to 2/10 to facilitate minimal limitation in daily activity secondary to pain symptoms.  Goal status: New   2. Patient will demonstrate independent use of home exercise program to facilitate ability to maintain/progress functional gains from skilled physical therapy services.  Goal status: New   3. Patient will demonstrate FOTO outcome > or = 57 % to indicate reduced disability due to condition.  Goal status: New   4.  Patient will demonstrate Left LE MMT 5/5 throughout to improve pt's functional mobility.   Goal status: New   5.  Patient will demonstrate ability to navigate 1 flight of stairs with single hand rail with step over step gait pattern.  Goal status: New   6.  Pt will be able to perform sit to stand with no UE support from standard height chair.  Goal status: New  7. Pt will improve her left knee active arc of motion from 2 to 115 degrees in order to improve functional mobility.   Goal Status:        PLAN:  PT FREQUENCY: 2-3x/week  PT DURATION: 12 weeks  PLANNED INTERVENTIONS: Therapeutic exercises, Therapeutic activity, Neuro Muscular re-education, Balance training, Gait training, Patient/Family education, Joint mobilization, Stair training, DME instructions, Dry Needling, Electrical stimulation, Traction, Cryotherapy, vasopneumatic deviceMoist heat, Taping, Ultrasound, Ionotophoresis 80m/ml Dexamethasone, and Manual therapy.  All included unless contraindicated  PLAN FOR NEXT SESSION: , Manual, LE strengthening, Nustep, vaso at end  Next MD visit: 05/02/22   SLaureen Abrahams PT, DPT 04/12/22 12:13 PM

## 2022-04-17 ENCOUNTER — Ambulatory Visit: Payer: PPO | Admitting: Physical Therapy

## 2022-04-17 ENCOUNTER — Encounter: Payer: Self-pay | Admitting: Physical Therapy

## 2022-04-17 DIAGNOSIS — M25662 Stiffness of left knee, not elsewhere classified: Secondary | ICD-10-CM | POA: Diagnosis not present

## 2022-04-17 DIAGNOSIS — R600 Localized edema: Secondary | ICD-10-CM | POA: Diagnosis not present

## 2022-04-17 DIAGNOSIS — M6281 Muscle weakness (generalized): Secondary | ICD-10-CM

## 2022-04-17 DIAGNOSIS — M25562 Pain in left knee: Secondary | ICD-10-CM | POA: Diagnosis not present

## 2022-04-17 NOTE — Therapy (Signed)
OUTPATIENT PHYSICAL THERAPY LOWER EXTREMITY TREATMENT NOTE   Patient Name: Kimberly Murray MRN: [id number] DOB:03-Jan-1951, 72 y.o., female Today's Date: 04/17/2022  END OF SESSION:  PT End of Session - 04/17/22 1151     Visit Number 5    Number of Visits 28    Date for PT Re-Evaluation 06/29/22    PT Start Time 1147    PT Stop Time 1230    PT Time Calculation (min) 43 min    Activity Tolerance Patient tolerated treatment well    Behavior During Therapy WFL for tasks assessed/performed               Past Medical History:  Diagnosis Date   Allergic rhinitis    Chronic headaches    Complication of anesthesia    Depression    on antidepressent medication   Fatty liver    History of acute bronchitis    Hyperlipidemia    Hypertension    Neuropathy    in toes   PONV (postoperative nausea and vomiting)    mild nausea after gallbladder surgery   Pre-diabetes    Sleep apnea    Past Surgical History:  Procedure Laterality Date   BUNIONECTOMY Right    CATARACT EXTRACTION W/ INTRAOCULAR LENS IMPLANT Bilateral    CHOLECYSTECTOMY  2006   EYE SURGERY     macular surgery   LAPAROSCOPIC HYSTERECTOMY  2005   TOTAL KNEE ARTHROPLASTY Left 03/19/2022   Procedure: LEFT TOTAL KNEE ARTHROPLASTY;  Surgeon: Leandrew Koyanagi, MD;  Location: [place];  Service: Orthopedics;  Laterality: Left;   Patient Active Problem List   Diagnosis Date Noted   Primary osteoarthritis of left knee 03/19/2022   Status post total left knee replacement 03/19/2022   Headache 01/28/2019   Obstructive sleep apnea 08/01/2018   Insomnia 08/01/2018   GERD (gastroesophageal reflux disease) 08/01/2018    PCP: Fanny Bien, MD   REFERRING PROVIDER: Leandrew Koyanagi, MD  REFERRING DIAG: [phone] (ICD-10-CM) - Primary osteoarthritis of left knee [phone] (ICD-10-CM) - Status post total left knee replacement  THERAPY DIAG:  Acute pain of left knee  Muscle weakness (generalized)  Localized edema  Stiffness  of left knee, not elsewhere classified  Rationale for Evaluation and Treatment: Rehabilitation  ONSET DATE: left TKA on 03/19/22  SUBJECTIVE:   SUBJECTIVE STATEMENT: Pt stating her knee is giving her a fit this morning due to the weather.   PERTINENT HISTORY: Left TKA on 03/19/22, fatty liver, HTN, neuropathy, depression, pre-diabetes  PAIN:  NPRS scale: 4-5/10 Pain location: left knee Pain description: achy, tightness Aggravating factors: sitting or standing Relieving factors: ice, pain meds  PRECAUTIONS: None  WEIGHT BEARING RESTRICTIONS: No  FALLS:  Has patient fallen in last 6 months? No  LIVING ENVIRONMENT: Lives with: lives with their family and lives with their spouse Lives in: House/apartment Stairs: Yes: External: 1 steps; none Has following equipment at home: None  OCCUPATION: retired  PLOF: Independent  PATIENT GOALS: walk better, comfortably, without device   OBJECTIVE:   DIAGNOSTIC FINDINGS:  Status post left total knee arthroplasty. No periprosthetic lucency or fracture. Near anatomic alignment of the prosthetic components. Air within the soft tissues is not unexpected postoperatively.  PATIENT SURVEYS:  04/03/22: FOTO intake:  31%    COGNITION: Overall cognitive status: WFL    SENSATION: 04/03/22 WFL  EDEMA:  Circumferential: Rt:  37.5 centimeters            Left: 47 centimeters  POSTURE: rounded shoulders, forward  head, and increased lumbar lordosis  PALPATION: 04/03/22: tender around joint line  LOWER EXTREMITY ROM:   ROM Right 04/03/22 Left 04/03/22 Left 04/10/22 Left 04/12/22 Left 04/17/22  Knee flexion A: 130 A: 85 P: 90 A: 88 P: 94 AA: 101 A: 102 P: 105  Knee extension A: 0 A: -10 P: -8 A: -8 P: -6  A: -8 P: -6   (Blank rows = not tested)  LOWER EXTREMITY MMT:  MMT Right 04/03/22 Left 04/03/22  Hip flexion 5/5 5/5  Hip extension    Hip abduction    Hip adduction    Hip internal rotation    Hip external rotation     Knee flexion 5/5 3/5  Knee extension 5/5 3/5  Ankle dorsiflexion 5/5 5/5   (Blank rows = not tested)    FUNCTIONAL TESTS:  04/03/22: 5 times sit to stand:   GAIT: Distance walked: 30 feet from clinic lobby to exam room Assistive device utilized: Single point cane Level of assistance: Modified independence Comments: antalgic gait pattern   TODAY'S TREATMENT                                                                          DATE: 04/17/22 TherEx:  Nustep: seat 9 Level 5 x 8 minutes Calf raises x 20 reps Step ups on 4 inch step x 15 c single UE support Standing hip abduction with L3 band x 10 reps bil Standing hip extension with L3 band x 10 reps bil Seated AA knee flexion 10 x 5-10 sec hold  AA heel slides with strap x 10 reps 5-10 sec hold Quad sets with towel under ankle x 15 holding 3 seconds Manual: PROM: left knee flexion and extension with overpressure Modalities: Vasopneumatic: 34 degrees, medium compression x 10 minutes    04/12/22 TherEx:  Nustep: seat 9 Level 5 x 8 minutes Calf raises x 20 reps Standing hip abduction with L3 band x 10 reps bil Standing hip extension with L3 band x 10 reps bil Seated AA knee flexion 10 x 10 sec hold (RLE providing assist) AA heel slides with strap x 10 reps Quad sets x 10 reps - mod cues needed with difficulty activating quad SAQ with 3 sec hold x 15 reps   Manual: PROM: left knee flexion and extension with overpressure  Modalities: Vasopneumatic: 34 degrees, medium compression x 10 minutes  04/10/22:  TherEx:  Nustep: seat 9 Level 5 x 7 minutes Standing calf raises: x 15 c UE support Standing hip abd x 15 each LE c UE support Sit to stand x 10 UE support Seated SLR: x 10  Seated hamstring stretch x 3 holding 30 seconds Supine SLR: x 10 each LE  Manual: PROM: left knee flexion and extension with overpressure  Modalities Vasopneumatic: 34 degrees, medium compression x 10 minutes    04/04/22: -Nu Step  Seat 9 level 2 UE/LE x72mn  -Supine quad sets x10 hold for 5 sec  -Supine heel slide x10 hold for 5 sec -Seated knee flexion AA x10 hold for 5 sec -Sit to stands raised mat table x5 with UE support  -Seated LAQ 2x10  Manual therapy -knee extension with overpressure 2x10 -knee flexion with overpressure 2x10  -  Vasopnuematic device X 10 min, medium compression, 34 deg to left knee   PATIENT EDUCATION:  Education details: HEP, POC Person educated: Patient Education method: Consulting civil engineer, Demonstration, Verbal cues, and Handouts Education comprehension: verbalized understanding, returned demonstration, and verbal cues required  HOME EXERCISE PROGRAM: Access Code: [email or web address] URL: [email or web address] Date: 04/03/2022 Prepared by: Kearney Hard  Exercises - Supine Quadricep Sets  - 3-4 x daily - 7 x weekly - 2 sets - 10 reps - 5 seconds hold - Supine Heel Slide with Strap  - 3-4 x daily - 7 x weekly - 10 reps - 5-10 seconds hold - Supine Active Straight Leg Raise  - 3-4 x daily - 7 x weekly - 2 sets - 10 reps - Seated Knee Flexion AAROM  - 3-4 x daily - 7 x weekly - 10 reps - 5-10 seconds hold - Sit to Stand with Counter Support  - 3-4 x daily - 7 x weekly - 10 reps  ASSESSMENT:  CLINICAL IMPRESSION: Pt arriving to therapy reporting 4-5/10 pain in her left knee. Pt stating more soreness today due to rainy weather. Pt tolerating all exercises with mild increase in pain with PROM at end ranges. Pt's knee flexion to 102 degrees actively and 105 passively. Continue to skilled PT to maximize pt's function.     Will continue to benefit from PT to maximize function.  OBJECTIVE IMPAIRMENTS: decreased activity tolerance, decreased balance, decreased mobility, difficulty walking, decreased ROM, decreased strength, increased edema, and pain.   ACTIVITY LIMITATIONS: bending, sitting, standing, squatting, sleeping, stairs, and transfers  PARTICIPATION LIMITATIONS: cleaning,  laundry, and community activity  PERSONAL FACTORS: 3+ comorbidities: see above  are also affecting patient's functional outcome.   REHAB POTENTIAL: Good  CLINICAL DECISION MAKING: Stable/uncomplicated  EVALUATION COMPLEXITY: Low   GOALS: Goals reviewed with patient? Yes  SHORT TERM GOALS: (target date for Short term goals are 3 weeks 04/27/22)   1.  Patient will demonstrate independent use of home exercise program to maintain progress from in clinic treatments.  Goal status: MET 04/17/22  LONG TERM GOALS: (target dates for all long term goals are 12 weeks  06/29/22)   1. Patient will demonstrate/report pain at worst less than or equal to 2/10 to facilitate minimal limitation in daily activity secondary to pain symptoms.  Goal status: New   2. Patient will demonstrate independent use of home exercise program to facilitate ability to maintain/progress functional gains from skilled physical therapy services.  Goal status: New   3. Patient will demonstrate FOTO outcome > or = 57 % to indicate reduced disability due to condition.  Goal status: New   4.  Patient will demonstrate Left LE MMT 5/5 throughout to improve pt's functional mobility.   Goal status: New   5.  Patient will demonstrate ability to navigate 1 flight of stairs with single hand rail with step over step gait pattern.  Goal status: New   6.  Pt will be able to perform sit to stand with no UE support from standard height chair.  Goal status: New  7. Pt will improve her left knee active arc of motion from 2 to 115 degrees in order to improve functional mobility.   Goal Status:        PLAN:  PT FREQUENCY: 2-3x/week  PT DURATION: 12 weeks  PLANNED INTERVENTIONS: Therapeutic exercises, Therapeutic activity, Neuro Muscular re-education, Balance training, Gait training, Patient/Family education, Joint mobilization, Stair training, DME instructions, Dry Needling, Electrical stimulation, Traction, Cryotherapy,  vasopneumatic deviceMoist heat,  Taping, Ultrasound, Ionotophoresis '4mg'$ /ml Dexamethasone, and Manual therapy.  All included unless contraindicated  PLAN FOR NEXT SESSION: Try Leg Press next visit, Manual, LE strengthening, Nustep, vaso at end  Next MD visit: 05/02/22   Kearney Hard, PT, MPT 04/17/22 11:55 AM   04/17/22 11:55 AM

## 2022-04-19 ENCOUNTER — Encounter: Payer: Self-pay | Admitting: Physical Therapy

## 2022-04-19 ENCOUNTER — Ambulatory Visit: Payer: PPO | Admitting: Physical Therapy

## 2022-04-19 DIAGNOSIS — R600 Localized edema: Secondary | ICD-10-CM | POA: Diagnosis not present

## 2022-04-19 DIAGNOSIS — M6281 Muscle weakness (generalized): Secondary | ICD-10-CM

## 2022-04-19 DIAGNOSIS — M25662 Stiffness of left knee, not elsewhere classified: Secondary | ICD-10-CM | POA: Diagnosis not present

## 2022-04-19 DIAGNOSIS — M25562 Pain in left knee: Secondary | ICD-10-CM

## 2022-04-19 NOTE — Therapy (Signed)
OUTPATIENT PHYSICAL THERAPY LOWER EXTREMITY TREATMENT NOTE   Patient Name: Kimberly Murray MRN: [id number] DOB:05/15/1950, 72 y.o., female Today's Date: 04/19/2022  END OF SESSION:  PT End of Session - 04/19/22 1143     Visit Number 6    Number of Visits 28    Date for PT Re-Evaluation 06/29/22    PT Start Time 1140    PT Stop Time 1228    PT Time Calculation (min) 48 min    Activity Tolerance Patient tolerated treatment well    Behavior During Therapy WFL for tasks assessed/performed                Past Medical History:  Diagnosis Date   Allergic rhinitis    Chronic headaches    Complication of anesthesia    Depression    on antidepressent medication   Fatty liver    History of acute bronchitis    Hyperlipidemia    Hypertension    Neuropathy    in toes   PONV (postoperative nausea and vomiting)    mild nausea after gallbladder surgery   Pre-diabetes    Sleep apnea    Past Surgical History:  Procedure Laterality Date   BUNIONECTOMY Right    CATARACT EXTRACTION W/ INTRAOCULAR LENS IMPLANT Bilateral    CHOLECYSTECTOMY  2006   EYE SURGERY     macular surgery   LAPAROSCOPIC HYSTERECTOMY  2005   TOTAL KNEE ARTHROPLASTY Left 03/19/2022   Procedure: LEFT TOTAL KNEE ARTHROPLASTY;  Surgeon: Leandrew Koyanagi, MD;  Location: [place];  Service: Orthopedics;  Laterality: Left;   Patient Active Problem List   Diagnosis Date Noted   Primary osteoarthritis of left knee 03/19/2022   Status post total left knee replacement 03/19/2022   Headache 01/28/2019   Obstructive sleep apnea 08/01/2018   Insomnia 08/01/2018   GERD (gastroesophageal reflux disease) 08/01/2018    PCP: Fanny Bien, MD   REFERRING PROVIDER: Leandrew Koyanagi, MD  REFERRING DIAG: [phone] (ICD-10-CM) - Primary osteoarthritis of left knee [phone] (ICD-10-CM) - Status post total left knee replacement  THERAPY DIAG:  Acute pain of left knee  Muscle weakness (generalized)  Localized  edema  Stiffness of left knee, not elsewhere classified  Rationale for Evaluation and Treatment: Rehabilitation  ONSET DATE: left TKA on 03/19/22  SUBJECTIVE:   SUBJECTIVE STATEMENT: Doing well, knee is just stiff.  Not doing HEP as much as she should.  PERTINENT HISTORY: Left TKA on 03/19/22, fatty liver, HTN, neuropathy, depression, pre-diabetes  PAIN:  NPRS scale: 4-5/10 Pain location: left knee Pain description: achy, tightness Aggravating factors: sitting or standing Relieving factors: ice, pain meds  PRECAUTIONS: None  WEIGHT BEARING RESTRICTIONS: No  FALLS:  Has patient fallen in last 6 months? No  LIVING ENVIRONMENT: Lives with: lives with their family and lives with their spouse Lives in: House/apartment Stairs: Yes: External: 1 steps; none Has following equipment at home: None  OCCUPATION: retired  PLOF: Independent  PATIENT GOALS: walk better, comfortably, without device   OBJECTIVE:   DIAGNOSTIC FINDINGS:  Status post left total knee arthroplasty. No periprosthetic lucency or fracture. Near anatomic alignment of the prosthetic components. Air within the soft tissues is not unexpected postoperatively.  PATIENT SURVEYS:  04/03/22: FOTO intake:  31%    COGNITION: Overall cognitive status: WFL    SENSATION: 04/03/22 WFL  EDEMA:  Circumferential: Rt:  37.5 centimeters            Left: 47 centimeters  POSTURE: rounded shoulders, forward  head, and increased lumbar lordosis  PALPATION: 04/03/22: tender around joint line  LOWER EXTREMITY ROM:   ROM Right 04/03/22 Left 04/03/22 Left 04/10/22 Left 04/12/22 Left 04/17/22 Left 04/19/22  Knee flexion A: 130 A: 85 P: 90 A: 88 P: 94 AA: 101 A: 102 P: 105 A: 106  Knee extension A: 0 A: -10 P: -8 A: -8 P: -6  A: -8 P: -6    (Blank rows = not tested)  LOWER EXTREMITY MMT:  MMT Right 04/03/22 Left 04/03/22  Hip flexion 5/5 5/5  Hip extension    Hip abduction    Hip adduction    Hip internal  rotation    Hip external rotation    Knee flexion 5/5 3/5  Knee extension 5/5 3/5  Ankle dorsiflexion 5/5 5/5   (Blank rows = not tested)    FUNCTIONAL TESTS:  04/03/22: 5 times sit to stand:   GAIT: Distance walked: 30 feet from clinic lobby to exam room Assistive device utilized: Single point cane Level of assistance: Modified independence Comments: antalgic gait pattern   TODAY'S TREATMENT                                                                          DATE: 04/19/22 TherEx:  Recumbent bike seat 4 x 8 min; partial revolutions Seated AA knee flexion 10 x 5-10 sec hold  Demonstrated ways to work on flexion at home including supine heel slides, seated knee flexion with anterior scoot and supine 90 deg hip flexion and passive knee flexion Seated LAQ on Lt 3#, 2x10; 3 sec hold Standing hip abduction with L3 band x 10 reps bil Standing hip extension with L3 band x 10 reps bil  Manual: PROM: left knee flexion and extension with overpressure Patella mobs in supine, instructed in home in long sitting  Modalities: Vasopneumatic: 34 degrees, medium compression x 10 minutes  04/17/22 TherEx:  Nustep: seat 9 Level 5 x 8 minutes Calf raises x 20 reps Step ups on 4 inch step x 15 c single UE support Standing hip abduction with L3 band x 10 reps bil Standing hip extension with L3 band x 10 reps bil Seated AA knee flexion 10 x 5-10 sec hold  AA heel slides with strap x 10 reps 5-10 sec hold Quad sets with towel under ankle x 15 holding 3 seconds Manual: PROM: left knee flexion and extension with overpressure Modalities: Vasopneumatic: 34 degrees, medium compression x 10 minutes    04/12/22 TherEx:  Nustep: seat 9 Level 5 x 8 minutes Calf raises x 20 reps Standing hip abduction with L3 band x 10 reps bil Standing hip extension with L3 band x 10 reps bil Seated AA knee flexion 10 x 10 sec hold (RLE providing assist) AA heel slides with strap x 10 reps Quad sets x 10  reps - mod cues needed with difficulty activating quad SAQ with 3 sec hold x 15 reps   Manual: PROM: left knee flexion and extension with overpressure  Modalities: Vasopneumatic: 34 degrees, medium compression x 10 minutes    PATIENT EDUCATION:  Education details: HEP, POC Person educated: Patient Education method: Consulting civil engineer, Demonstration, Verbal cues, and Handouts Education comprehension: verbalized understanding, returned demonstration, and verbal  cues required  HOME EXERCISE PROGRAM: Access Code: [email or web address] URL: [email or web address] Date: 04/03/2022 Prepared by: Kearney Hard  Exercises - Supine Quadricep Sets  - 3-4 x daily - 7 x weekly - 2 sets - 10 reps - 5 seconds hold - Supine Heel Slide with Strap  - 3-4 x daily - 7 x weekly - 10 reps - 5-10 seconds hold - Supine Active Straight Leg Raise  - 3-4 x daily - 7 x weekly - 2 sets - 10 reps - Seated Knee Flexion AAROM  - 3-4 x daily - 7 x weekly - 10 reps - 5-10 seconds hold - Sit to Stand with Counter Support  - 3-4 x daily - 7 x weekly - 10 reps  ASSESSMENT:  CLINICAL IMPRESSION: Pt tolerated session well today with improvement in flexion noted despite increased pain at end range.  Will continue to benefit from PT to maximize function.   OBJECTIVE IMPAIRMENTS: decreased activity tolerance, decreased balance, decreased mobility, difficulty walking, decreased ROM, decreased strength, increased edema, and pain.   ACTIVITY LIMITATIONS: bending, sitting, standing, squatting, sleeping, stairs, and transfers  PARTICIPATION LIMITATIONS: cleaning, laundry, and community activity  PERSONAL FACTORS: 3+ comorbidities: see above  are also affecting patient's functional outcome.   REHAB POTENTIAL: Good  CLINICAL DECISION MAKING: Stable/uncomplicated  EVALUATION COMPLEXITY: Low   GOALS: Goals reviewed with patient? Yes  SHORT TERM GOALS: (target date for Short term goals are 3 weeks 04/27/22)   1.   Patient will demonstrate independent use of home exercise program to maintain progress from in clinic treatments.  Goal status: MET 04/17/22  LONG TERM GOALS: (target dates for all long term goals are 12 weeks  06/29/22)   1. Patient will demonstrate/report pain at worst less than or equal to 2/10 to facilitate minimal limitation in daily activity secondary to pain symptoms.  Goal status: New   2. Patient will demonstrate independent use of home exercise program to facilitate ability to maintain/progress functional gains from skilled physical therapy services.  Goal status: New   3. Patient will demonstrate FOTO outcome > or = 57 % to indicate reduced disability due to condition.  Goal status: New   4.  Patient will demonstrate Left LE MMT 5/5 throughout to improve pt's functional mobility.   Goal status: New   5.  Patient will demonstrate ability to navigate 1 flight of stairs with single hand rail with step over step gait pattern.  Goal status: New   6.  Pt will be able to perform sit to stand with no UE support from standard height chair.  Goal status: New  7. Pt will improve her left knee active arc of motion from 2 to 115 degrees in order to improve functional mobility.   Goal Status:        PLAN:  PT FREQUENCY: 2-3x/week  PT DURATION: 12 weeks  PLANNED INTERVENTIONS: Therapeutic exercises, Therapeutic activity, Neuro Muscular re-education, Balance training, Gait training, Patient/Family education, Joint mobilization, Stair training, DME instructions, Dry Needling, Electrical stimulation, Traction, Cryotherapy, vasopneumatic deviceMoist heat, Taping, Ultrasound, Ionotophoresis '4mg'$ /ml Dexamethasone, and Manual therapy.  All included unless contraindicated  PLAN FOR NEXT SESSION: Try Leg Press next visit, Manual, LE strengthening, Nustep, vaso at end  Next MD visit: 05/02/22   Laureen Abrahams, PT, DPT 04/19/22 12:49 PM

## 2022-04-20 NOTE — Therapy (Deleted)
OUTPATIENT PHYSICAL THERAPY LOWER EXTREMITY TREATMENT NOTE   Patient Name: Kimberly Murray MRN: [id number] DOB:November 08, 1950, 72 y.o., female Today's Date: 04/20/2022  END OF SESSION:       Past Medical History:  Diagnosis Date   Allergic rhinitis    Chronic headaches    Complication of anesthesia    Depression    on antidepressent medication   Fatty liver    History of acute bronchitis    Hyperlipidemia    Hypertension    Neuropathy    in toes   PONV (postoperative nausea and vomiting)    mild nausea after gallbladder surgery   Pre-diabetes    Sleep apnea    Past Surgical History:  Procedure Laterality Date   BUNIONECTOMY Right    CATARACT EXTRACTION W/ INTRAOCULAR LENS IMPLANT Bilateral    CHOLECYSTECTOMY  2006   EYE SURGERY     macular surgery   LAPAROSCOPIC HYSTERECTOMY  2005   TOTAL KNEE ARTHROPLASTY Left 03/19/2022   Procedure: LEFT TOTAL KNEE ARTHROPLASTY;  Surgeon: Leandrew Koyanagi, MD;  Location: [facility];  Service: Orthopedics;  Laterality: Left;   Patient Active Problem List   Diagnosis Date Noted   Primary osteoarthritis of left knee 03/19/2022   Status post total left knee replacement 03/19/2022   Headache 01/28/2019   Obstructive sleep apnea 08/01/2018   Insomnia 08/01/2018   GERD (gastroesophageal reflux disease) 08/01/2018    PCP: Fanny Bien, MD   REFERRING PROVIDER: Leandrew Koyanagi, MD  REFERRING DIAG: [phone] (ICD-10-CM) - Primary osteoarthritis of left knee [phone] (ICD-10-CM) - Status post total left knee replacement  THERAPY DIAG:  No diagnosis found.  Rationale for Evaluation and Treatment: Rehabilitation  ONSET DATE: left TKA on 03/19/22  SUBJECTIVE:   SUBJECTIVE STATEMENT: Doing well, knee is just stiff.  Not doing HEP as much as she should.  PERTINENT HISTORY: Left TKA on 03/19/22, fatty liver, HTN, neuropathy, depression, pre-diabetes  PAIN:  NPRS scale: 4-5/10 Pain location: left knee Pain description: achy,  tightness Aggravating factors: sitting or standing Relieving factors: ice, pain meds  PRECAUTIONS: None  WEIGHT BEARING RESTRICTIONS: No  FALLS:  Has patient fallen in last 6 months? No  LIVING ENVIRONMENT: Lives with: lives with their family and lives with their spouse Lives in: House/apartment Stairs: Yes: External: 1 steps; none Has following equipment at home: None  OCCUPATION: retired  PLOF: Independent  PATIENT GOALS: walk better, comfortably, without device   OBJECTIVE:   DIAGNOSTIC FINDINGS:  Status post left total knee arthroplasty. No periprosthetic lucency or fracture. Near anatomic alignment of the prosthetic components. Air within the soft tissues is not unexpected postoperatively.  PATIENT SURVEYS:  04/03/22: FOTO intake:  31%    COGNITION: Overall cognitive status: WFL    SENSATION: 04/03/22 WFL  EDEMA:  Circumferential: Rt:  37.5 centimeters            Left: 47 centimeters  POSTURE: rounded shoulders, forward head, and increased lumbar lordosis  PALPATION: 04/03/22: tender around joint line  LOWER EXTREMITY ROM:   ROM Right 04/03/22 Left 04/03/22 Left 04/10/22 Left 04/12/22 Left 04/17/22 Left 04/19/22  Knee flexion A: 130 A: 85 P: 90 A: 88 P: 94 AA: 101 A: 102 P: 105 A: 106  Knee extension A: 0 A: -10 P: -8 A: -8 P: -6  A: -8 P: -6    (Blank rows = not tested)  LOWER EXTREMITY MMT:  MMT Right 04/03/22 Left 04/03/22  Hip flexion 5/5 5/5  Hip extension  Hip abduction    Hip adduction    Hip internal rotation    Hip external rotation    Knee flexion 5/5 3/5  Knee extension 5/5 3/5  Ankle dorsiflexion 5/5 5/5   (Blank rows = not tested)    FUNCTIONAL TESTS:  04/03/22: 5 times sit to stand:   GAIT: Distance walked: 30 feet from clinic lobby to exam room Assistive device utilized: Single point cane Level of assistance: Modified independence Comments: antalgic gait pattern   TODAY'S TREATMENT                                                                           DATE: 04/19/22 TherEx:  Recumbent bike seat 4 x 8 min; partial revolutions Seated AA knee flexion 10 x 5-10 sec hold  Demonstrated ways to work on flexion at home including supine heel slides, seated knee flexion with anterior scoot and supine 90 deg hip flexion and passive knee flexion Seated LAQ on Lt 3#, 2x10; 3 sec hold Standing hip abduction with L3 band x 10 reps bil Standing hip extension with L3 band x 10 reps bil  Manual: PROM: left knee flexion and extension with overpressure Patella mobs in supine, instructed in home in long sitting  Modalities: Vasopneumatic: 34 degrees, medium compression x 10 minutes  04/17/22 TherEx:  Nustep: seat 9 Level 5 x 8 minutes Calf raises x 20 reps Step ups on 4 inch step x 15 c single UE support Standing hip abduction with L3 band x 10 reps bil Standing hip extension with L3 band x 10 reps bil Seated AA knee flexion 10 x 5-10 sec hold  AA heel slides with strap x 10 reps 5-10 sec hold Quad sets with towel under ankle x 15 holding 3 seconds Manual: PROM: left knee flexion and extension with overpressure Modalities: Vasopneumatic: 34 degrees, medium compression x 10 minutes    04/12/22 TherEx:  Nustep: seat 9 Level 5 x 8 minutes Calf raises x 20 reps Standing hip abduction with L3 band x 10 reps bil Standing hip extension with L3 band x 10 reps bil Seated AA knee flexion 10 x 10 sec hold (RLE providing assist) AA heel slides with strap x 10 reps Quad sets x 10 reps - mod cues needed with difficulty activating quad SAQ with 3 sec hold x 15 reps   Manual: PROM: left knee flexion and extension with overpressure  Modalities: Vasopneumatic: 34 degrees, medium compression x 10 minutes    PATIENT EDUCATION:  Education details: HEP, POC Person educated: Patient Education method: Consulting civil engineer, Demonstration, Verbal cues, and Handouts Education comprehension: verbalized understanding,  returned demonstration, and verbal cues required  HOME EXERCISE PROGRAM: Access Code: [email or web address] URL: [email or web address] Date: 04/03/2022 Prepared by: Kearney Hard  Exercises - Supine Quadricep Sets  - 3-4 x daily - 7 x weekly - 2 sets - 10 reps - 5 seconds hold - Supine Heel Slide with Strap  - 3-4 x daily - 7 x weekly - 10 reps - 5-10 seconds hold - Supine Active Straight Leg Raise  - 3-4 x daily - 7 x weekly - 2 sets - 10 reps - Seated Knee Flexion AAROM  - 3-4 x daily - 7 x weekly -  10 reps - 5-10 seconds hold - Sit to Stand with Counter Support  - 3-4 x daily - 7 x weekly - 10 reps  ASSESSMENT:  CLINICAL IMPRESSION: Pt tolerated session well today with improvement in flexion noted despite increased pain at end range.  Will continue to benefit from PT to maximize function.   OBJECTIVE IMPAIRMENTS: decreased activity tolerance, decreased balance, decreased mobility, difficulty walking, decreased ROM, decreased strength, increased edema, and pain.   ACTIVITY LIMITATIONS: bending, sitting, standing, squatting, sleeping, stairs, and transfers  PARTICIPATION LIMITATIONS: cleaning, laundry, and community activity  PERSONAL FACTORS: 3+ comorbidities: see above  are also affecting patient's functional outcome.   REHAB POTENTIAL: Good  CLINICAL DECISION MAKING: Stable/uncomplicated  EVALUATION COMPLEXITY: Low   GOALS: Goals reviewed with patient? Yes  SHORT TERM GOALS: (target date for Short term goals are 3 weeks 04/27/22)   1.  Patient will demonstrate independent use of home exercise program to maintain progress from in clinic treatments.  Goal status: MET 04/17/22  LONG TERM GOALS: (target dates for all long term goals are 12 weeks  06/29/22)   1. Patient will demonstrate/report pain at worst less than or equal to 2/10 to facilitate minimal limitation in daily activity secondary to pain symptoms.  Goal status: New   2. Patient will demonstrate  independent use of home exercise program to facilitate ability to maintain/progress functional gains from skilled physical therapy services.  Goal status: New   3. Patient will demonstrate FOTO outcome > or = 57 % to indicate reduced disability due to condition.  Goal status: New   4.  Patient will demonstrate Left LE MMT 5/5 throughout to improve pt's functional mobility.   Goal status: New   5.  Patient will demonstrate ability to navigate 1 flight of stairs with single hand rail with step over step gait pattern.  Goal status: New   6.  Pt will be able to perform sit to stand with no UE support from standard height chair.  Goal status: New  7. Pt will improve her left knee active arc of motion from 2 to 115 degrees in order to improve functional mobility.   Goal Status:        PLAN:  PT FREQUENCY: 2-3x/week  PT DURATION: 12 weeks  PLANNED INTERVENTIONS: Therapeutic exercises, Therapeutic activity, Neuro Muscular re-education, Balance training, Gait training, Patient/Family education, Joint mobilization, Stair training, DME instructions, Dry Needling, Electrical stimulation, Traction, Cryotherapy, vasopneumatic deviceMoist heat, Taping, Ultrasound, Ionotophoresis '4mg'$ /ml Dexamethasone, and Manual therapy.  All included unless contraindicated  PLAN FOR NEXT SESSION: Try Leg Press next visit, Manual, LE strengthening, Nustep, vaso at end  Next MD visit: 05/02/22   Rudi Heap PT, DPT 04/20/22  10:45 AM

## 2022-04-21 ENCOUNTER — Other Ambulatory Visit: Payer: Self-pay | Admitting: Physician Assistant

## 2022-04-23 ENCOUNTER — Encounter: Payer: PPO | Admitting: Physical Therapy

## 2022-04-23 ENCOUNTER — Telehealth: Payer: Self-pay | Admitting: *Deleted

## 2022-04-23 NOTE — Telephone Encounter (Signed)
All sounds good.  Thank you

## 2022-04-23 NOTE — Telephone Encounter (Signed)
Patient called and states she has been running a fever, stomach cramps, feels nauseated and just generally terrible. The way she describes sounds more like a stomach flu. LTKA on 03/19/22. She hasn't eaten or drank much. I informed her to take her Zofran for nausea and to try and eat very bland diet today (dry toast, rice, chicken broth) and to drink plenty of fluids. She is only taking a muscle relaxer and Tylenol as needed. She stopped her pain medication about a week ago. She asked if it was side effects from stopping pain medication and I assured her this sounds like a stomach bug and not withdrawal symptoms. Knee incision is not red, swelling has actually lessened and she is not having any new pains from the knee. Told her to call PCP if symptoms don't improve, but this does not sound Orthopedic in nature.

## 2022-04-24 ENCOUNTER — Encounter: Payer: PPO | Admitting: Physical Therapy

## 2022-04-26 ENCOUNTER — Ambulatory Visit: Payer: PPO | Admitting: Physical Therapy

## 2022-04-26 ENCOUNTER — Encounter: Payer: Self-pay | Admitting: Physical Therapy

## 2022-04-26 DIAGNOSIS — M6281 Muscle weakness (generalized): Secondary | ICD-10-CM

## 2022-04-26 DIAGNOSIS — R600 Localized edema: Secondary | ICD-10-CM

## 2022-04-26 DIAGNOSIS — M25562 Pain in left knee: Secondary | ICD-10-CM

## 2022-04-26 DIAGNOSIS — M25662 Stiffness of left knee, not elsewhere classified: Secondary | ICD-10-CM

## 2022-04-26 NOTE — Therapy (Signed)
OUTPATIENT PHYSICAL THERAPY LOWER EXTREMITY TREATMENT NOTE   Patient Name: Kimberly Murray MRN: [id number] DOB:Jul 28, 1950, 72 y.o., female Today's Date: 04/26/2022  END OF SESSION:  PT End of Session - 04/26/22 1348     Visit Number 7    Number of Visits 28    Date for PT Re-Evaluation 06/29/22    PT Start Time [id number]    PT Stop Time 1428    PT Time Calculation (min) 43 min    Activity Tolerance Patient tolerated treatment well    Behavior During Therapy WFL for tasks assessed/performed                 Past Medical History:  Diagnosis Date   Allergic rhinitis    Chronic headaches    Complication of anesthesia    Depression    on antidepressent medication   Fatty liver    History of acute bronchitis    Hyperlipidemia    Hypertension    Neuropathy    in toes   PONV (postoperative nausea and vomiting)    mild nausea after gallbladder surgery   Pre-diabetes    Sleep apnea    Past Surgical History:  Procedure Laterality Date   BUNIONECTOMY Right    CATARACT EXTRACTION W/ INTRAOCULAR LENS IMPLANT Bilateral    CHOLECYSTECTOMY  2006   EYE SURGERY     macular surgery   LAPAROSCOPIC HYSTERECTOMY  2005   TOTAL KNEE ARTHROPLASTY Left 03/19/2022   Procedure: LEFT TOTAL KNEE ARTHROPLASTY;  Surgeon: Leandrew Koyanagi, MD;  Location: [facility];  Service: Orthopedics;  Laterality: Left;   Patient Active Problem List   Diagnosis Date Noted   Primary osteoarthritis of left knee 03/19/2022   Status post total left knee replacement 03/19/2022   Headache 01/28/2019   Obstructive sleep apnea 08/01/2018   Insomnia 08/01/2018   GERD (gastroesophageal reflux disease) 08/01/2018    PCP: Fanny Bien, MD   REFERRING PROVIDER: Leandrew Koyanagi, MD  REFERRING DIAG: [phone] (ICD-10-CM) - Primary osteoarthritis of left knee [phone] (ICD-10-CM) - Status post total left knee replacement  THERAPY DIAG:  Acute pain of left knee  Muscle weakness (generalized)  Localized  edema  Stiffness of left knee, not elsewhere classified  Rationale for Evaluation and Treatment: Rehabilitation  ONSET DATE: left TKA on 03/19/22  SUBJECTIVE:   SUBJECTIVE STATEMENT: Has been sick the past week and still recovering; feels like knee is a little more stiff because of that  PERTINENT HISTORY: Left TKA on 03/19/22, fatty liver, HTN, neuropathy, depression, pre-diabetes  PAIN:  NPRS scale: 4-5/10 Pain location: left knee Pain description: achy, tightness Aggravating factors: sitting or standing Relieving factors: ice, pain meds  PRECAUTIONS: None  WEIGHT BEARING RESTRICTIONS: No  FALLS:  Has patient fallen in last 6 months? No  LIVING ENVIRONMENT: Lives with: lives with their family and lives with their spouse Lives in: House/apartment Stairs: Yes: External: 1 steps; none Has following equipment at home: None  OCCUPATION: retired  PLOF: Independent  PATIENT GOALS: walk better, comfortably, without device   OBJECTIVE:   DIAGNOSTIC FINDINGS:  Status post left total knee arthroplasty. No periprosthetic lucency or fracture. Near anatomic alignment of the prosthetic components. Air within the soft tissues is not unexpected postoperatively.  PATIENT SURVEYS:  04/03/22: FOTO intake:  31%    COGNITION: Overall cognitive status: WFL    SENSATION: 04/03/22 WFL  EDEMA:  Circumferential: Rt:  37.5 centimeters            Left: 47  centimeters  POSTURE: rounded shoulders, forward head, and increased lumbar lordosis  PALPATION: 04/03/22: tender around joint line  LOWER EXTREMITY ROM:   ROM Right 04/03/22 Left 04/03/22 Left 04/10/22 Left 04/12/22 Left 04/17/22 Left 04/19/22 Left 04/26/22  Knee flexion A: 130 A: 85 P: 90 A: 88 P: 94 AA: 101 A: 102 P: 105 A: 106 A: 97 P: 104  Knee extension A: 0 A: -10 P: -8 A: -8 P: -6  A: -8 P: -6  A: 0 (LAQ)   (Blank rows = not tested)  LOWER EXTREMITY MMT:  MMT Right 04/03/22 Left 04/03/22  Hip flexion 5/5  5/5  Hip extension    Hip abduction    Hip adduction    Hip internal rotation    Hip external rotation    Knee flexion 5/5 3/5  Knee extension 5/5 3/5  Ankle dorsiflexion 5/5 5/5   (Blank rows = not tested)    FUNCTIONAL TESTS:  04/03/22: 5 times sit to stand:   GAIT: Distance walked: 30 feet from clinic lobby to exam room Assistive device utilized: Single point cane Level of assistance: Modified independence Comments: antalgic gait pattern   TODAY'S TREATMENT                                                                          DATE: 04/26/22 TherEx NuStep L5 x 8 min Leg Press 75# bil 2x10; then LLE only 25# 2x10 Forward step ups onto 6" step 2x10 with LLE leading; light UE support ROM measurements - see above  Modalities: Vasopneumatic: 34 degrees, medium compression x 10 minutes  (Pt with increased fatigue and lightheadedness so session limited today; BP 119/60)  04/19/22 TherEx:  Recumbent bike seat 4 x 8 min; partial revolutions Seated AA knee flexion 10 x 5-10 sec hold  Demonstrated ways to work on flexion at home including supine heel slides, seated knee flexion with anterior scoot and supine 90 deg hip flexion and passive knee flexion Seated LAQ on Lt 3#, 2x10; 3 sec hold Standing hip abduction with L3 band x 10 reps bil Standing hip extension with L3 band x 10 reps bil  Manual: PROM: left knee flexion and extension with overpressure Patella mobs in supine, instructed in home in long sitting  Modalities: Vasopneumatic: 34 degrees, medium compression x 10 minutes  04/17/22 TherEx:  Nustep: seat 9 Level 5 x 8 minutes Calf raises x 20 reps Step ups on 4 inch step x 15 c single UE support Standing hip abduction with L3 band x 10 reps bil Standing hip extension with L3 band x 10 reps bil Seated AA knee flexion 10 x 5-10 sec hold  AA heel slides with strap x 10 reps 5-10 sec hold Quad sets with towel under ankle x 15 holding 3 seconds Manual: PROM: left  knee flexion and extension with overpressure Modalities: Vasopneumatic: 34 degrees, medium compression x 10 minutes    04/12/22 TherEx:  Nustep: seat 9 Level 5 x 8 minutes Calf raises x 20 reps Standing hip abduction with L3 band x 10 reps bil Standing hip extension with L3 band x 10 reps bil Seated AA knee flexion 10 x 10 sec hold (RLE providing assist) AA heel slides with strap  x 10 reps Quad sets x 10 reps - mod cues needed with difficulty activating quad SAQ with 3 sec hold x 15 reps   Manual: PROM: left knee flexion and extension with overpressure  Modalities: Vasopneumatic: 34 degrees, medium compression x 10 minutes    PATIENT EDUCATION:  Education details: HEP, POC Person educated: Patient Education method: Consulting civil engineer, Demonstration, Verbal cues, and Handouts Education comprehension: verbalized understanding, returned demonstration, and verbal cues required  HOME EXERCISE PROGRAM: Access Code: [email or web address] URL: [email or web address] Date: 04/03/2022 Prepared by: Kearney Hard  Exercises - Supine Quadricep Sets  - 3-4 x daily - 7 x weekly - 2 sets - 10 reps - 5 seconds hold - Supine Heel Slide with Strap  - 3-4 x daily - 7 x weekly - 10 reps - 5-10 seconds hold - Supine Active Straight Leg Raise  - 3-4 x daily - 7 x weekly - 2 sets - 10 reps - Seated Knee Flexion AAROM  - 3-4 x daily - 7 x weekly - 10 reps - 5-10 seconds hold - Sit to Stand with Counter Support  - 3-4 x daily - 7 x weekly - 10 reps  ASSESSMENT:  CLINICAL IMPRESSION: Session limited due to fatigue from recent illness, but overall pt doing okay with PT.  Flexion slightly decreased but anticipate that will recover as pt recovers.  Will continue to benefit from PT to maximize function.    OBJECTIVE IMPAIRMENTS: decreased activity tolerance, decreased balance, decreased mobility, difficulty walking, decreased ROM, decreased strength, increased edema, and pain.   ACTIVITY LIMITATIONS:  bending, sitting, standing, squatting, sleeping, stairs, and transfers  PARTICIPATION LIMITATIONS: cleaning, laundry, and community activity  PERSONAL FACTORS: 3+ comorbidities: see above  are also affecting patient's functional outcome.   REHAB POTENTIAL: Good  CLINICAL DECISION MAKING: Stable/uncomplicated  EVALUATION COMPLEXITY: Low   GOALS: Goals reviewed with patient? Yes  SHORT TERM GOALS: (target date for Short term goals are 3 weeks 04/27/22)   1.  Patient will demonstrate independent use of home exercise program to maintain progress from in clinic treatments.  Goal status: MET 04/17/22  LONG TERM GOALS: (target dates for all long term goals are 12 weeks  06/29/22)   1. Patient will demonstrate/report pain at worst less than or equal to 2/10 to facilitate minimal limitation in daily activity secondary to pain symptoms.  Goal status: New   2. Patient will demonstrate independent use of home exercise program to facilitate ability to maintain/progress functional gains from skilled physical therapy services.  Goal status: New   3. Patient will demonstrate FOTO outcome > or = 57 % to indicate reduced disability due to condition.  Goal status: New   4.  Patient will demonstrate Left LE MMT 5/5 throughout to improve pt's functional mobility.   Goal status: New   5.  Patient will demonstrate ability to navigate 1 flight of stairs with single hand rail with step over step gait pattern.  Goal status: New   6.  Pt will be able to perform sit to stand with no UE support from standard height chair.  Goal status: New  7. Pt will improve her left knee active arc of motion from 2 to 115 degrees in order to improve functional mobility.   Goal Status:        PLAN:  PT FREQUENCY: 2-3x/week  PT DURATION: 12 weeks  PLANNED INTERVENTIONS: Therapeutic exercises, Therapeutic activity, Neuro Muscular re-education, Balance training, Gait training, Patient/Family education, Joint  mobilization, Stair training, DME instructions, Dry  Needling, Electrical stimulation, Traction, Cryotherapy, vasopneumatic deviceMoist heat, Taping, Ultrasound, Ionotophoresis '4mg'$ /ml Dexamethasone, and Manual therapy.  All included unless contraindicated  PLAN FOR NEXT SESSION: prefers NuStep or SciFit bike,  Manual, LE strengthening, Vaso at end  Next MD visit: 05/02/22   Laureen Abrahams, PT, DPT 04/26/22 2:21 PM

## 2022-05-01 ENCOUNTER — Ambulatory Visit: Payer: PPO | Admitting: Physical Therapy

## 2022-05-01 ENCOUNTER — Encounter: Payer: Self-pay | Admitting: Physical Therapy

## 2022-05-01 DIAGNOSIS — R600 Localized edema: Secondary | ICD-10-CM

## 2022-05-01 DIAGNOSIS — M6281 Muscle weakness (generalized): Secondary | ICD-10-CM | POA: Diagnosis not present

## 2022-05-01 DIAGNOSIS — M25662 Stiffness of left knee, not elsewhere classified: Secondary | ICD-10-CM

## 2022-05-01 DIAGNOSIS — M25562 Pain in left knee: Secondary | ICD-10-CM

## 2022-05-01 NOTE — Therapy (Signed)
OUTPATIENT PHYSICAL THERAPY LOWER EXTREMITY TREATMENT NOTE   Patient Name: Kimberly Murray MRN: [id number] DOB:01-06-1951, 72 y.o., female Today's Date: 05/01/2022  END OF SESSION:  PT End of Session - 05/01/22 1216     Visit Number 8    Number of Visits 28    Date for PT Re-Evaluation 06/29/22    PT Start Time 1155    PT Stop Time 1235    PT Time Calculation (min) 40 min    Activity Tolerance Patient tolerated treatment well    Behavior During Therapy WFL for tasks assessed/performed                  Past Medical History:  Diagnosis Date   Allergic rhinitis    Chronic headaches    Complication of anesthesia    Depression    on antidepressent medication   Fatty liver    History of acute bronchitis    Hyperlipidemia    Hypertension    Neuropathy    in toes   PONV (postoperative nausea and vomiting)    mild nausea after gallbladder surgery   Pre-diabetes    Sleep apnea    Past Surgical History:  Procedure Laterality Date   BUNIONECTOMY Right    CATARACT EXTRACTION W/ INTRAOCULAR LENS IMPLANT Bilateral    CHOLECYSTECTOMY  2006   EYE SURGERY     macular surgery   LAPAROSCOPIC HYSTERECTOMY  2005   TOTAL KNEE ARTHROPLASTY Left 03/19/2022   Procedure: LEFT TOTAL KNEE ARTHROPLASTY;  Surgeon: Leandrew Koyanagi, MD;  Location: [place];  Service: Orthopedics;  Laterality: Left;   Patient Active Problem List   Diagnosis Date Noted   Primary osteoarthritis of left knee 03/19/2022   Status post total left knee replacement 03/19/2022   Headache 01/28/2019   Obstructive sleep apnea 08/01/2018   Insomnia 08/01/2018   GERD (gastroesophageal reflux disease) 08/01/2018    PCP: Fanny Bien, MD   REFERRING PROVIDER: Leandrew Koyanagi, MD  REFERRING DIAG: [phone] (ICD-10-CM) - Primary osteoarthritis of left knee [phone] (ICD-10-CM) - Status post total left knee replacement  THERAPY DIAG:  Acute pain of left knee  Muscle weakness (generalized)  Localized  edema  Stiffness of left knee, not elsewhere classified  Rationale for Evaluation and Treatment: Rehabilitation  ONSET DATE: left TKA on 03/19/22  SUBJECTIVE:   SUBJECTIVE STATEMENT: Pt feels she is doing better after being sick over a week ago.   PERTINENT HISTORY: Left TKA on 03/19/22, fatty liver, HTN, neuropathy, depression, pre-diabetes  PAIN:  NPRS scale: 4-5/10 Pain location: left knee Pain description: achy, tightness Aggravating factors: sitting or standing Relieving factors: ice, pain meds  PRECAUTIONS: None  WEIGHT BEARING RESTRICTIONS: No  FALLS:  Has patient fallen in last 6 months? No  LIVING ENVIRONMENT: Lives with: lives with their family and lives with their spouse Lives in: House/apartment Stairs: Yes: External: 1 steps; none Has following equipment at home: None  OCCUPATION: retired  PLOF: Independent  PATIENT GOALS: walk better, comfortably, without device   OBJECTIVE:   DIAGNOSTIC FINDINGS:  Status post left total knee arthroplasty. No periprosthetic lucency or fracture. Near anatomic alignment of the prosthetic components. Air within the soft tissues is not unexpected postoperatively.  PATIENT SURVEYS:  04/03/22: FOTO intake:  31%    COGNITION: Overall cognitive status: WFL    SENSATION: 04/03/22 WFL  EDEMA:  Circumferential: Rt:  37.5 centimeters            Left: 47 centimeters  POSTURE: rounded shoulders,  forward head, and increased lumbar lordosis  PALPATION: 04/03/22: tender around joint line  LOWER EXTREMITY ROM:   ROM Right 04/03/22 Left 04/03/22 Left 04/10/22 Left 04/12/22 Left 04/17/22 Left 04/19/22 Left 04/26/22 Left 05/01/22 supine  Knee flexion A: 130 A: 85 P: 90 A: 88 P: 94 AA: 101 A: 102 P: 105 A: 106 A: 97 P: 104 A: 100 P: 105  Knee extension A: 0 A: -10 P: -8 A: -8 P: -6  A: -8 P: -6  A: 0 (LAQ) A: 0   (Blank rows = not tested)  LOWER EXTREMITY MMT:  MMT Right 04/03/22 Left 04/03/22  Hip flexion 5/5  5/5  Hip extension    Hip abduction    Hip adduction    Hip internal rotation    Hip external rotation    Knee flexion 5/5 3/5  Knee extension 5/5 3/5  Ankle dorsiflexion 5/5 5/5   (Blank rows = not tested)    FUNCTIONAL TESTS:  04/03/22: 5 times sit to stand:   GAIT: Distance walked: 30 feet from clinic lobby to exam room Assistive device utilized: Single point cane Level of assistance: Modified independence Comments: antalgic gait pattern   TODAY'S TREATMENT                                                                          DATE: 04/19/22 TherEx:  Nustep: Level 5 x 8 minutes Calf stretch on slant board: x 2 holding 30 sec Step ups on 6 inch step x 15 Lateral step ups on 6 inch step x 10 each direction Leg Press: 81# 2 x 10 bil LE's Leg Press: 37# 2 x 10 Left LE only Heel slides x 10 Manual: PROM: left knee flexion and extension with overpressure Patella mobs in supine, instructed in home in long sitting     04/26/22 TherEx NuStep L5 x 8 min Leg Press 75# bil 2x10; then LLE only 25# 2x10 Forward step ups onto 6" step 2x10 with LLE leading; light UE support ROM measurements - see above  Modalities: Vasopneumatic: 34 degrees, medium compression x 10 minutes  (Pt with increased fatigue and lightheadedness so session limited today; BP 119/60)  04/19/22 TherEx:  Recumbent bike seat 4 x 8 min; partial revolutions Seated AA knee flexion 10 x 5-10 sec hold  Demonstrated ways to work on flexion at home including supine heel slides, seated knee flexion with anterior scoot and supine 90 deg hip flexion and passive knee flexion Seated LAQ on Lt 3#, 2x10; 3 sec hold Standing hip abduction with L3 band x 10 reps bil Standing hip extension with L3 band x 10 reps bil  Manual: PROM: left knee flexion and extension with overpressure Patella mobs in supine, instructed in home in long sitting  Modalities: Vasopneumatic: 34 degrees, medium compression x 10  minutes      PATIENT EDUCATION:  Education details: HEP, POC Person educated: Patient Education method: Consulting civil engineer, Demonstration, Verbal cues, and Handouts Education comprehension: verbalized understanding, returned demonstration, and verbal cues required  HOME EXERCISE PROGRAM: Access Code: [email or web address] URL: [email or web address] Date: 04/03/2022 Prepared by: Kearney Hard  Exercises - Supine Quadricep Sets  - 3-4 x daily - 7 x weekly - 2 sets - 10 reps -  5 seconds hold - Supine Heel Slide with Strap  - 3-4 x daily - 7 x weekly - 10 reps - 5-10 seconds hold - Supine Active Straight Leg Raise  - 3-4 x daily - 7 x weekly - 2 sets - 10 reps - Seated Knee Flexion AAROM  - 3-4 x daily - 7 x weekly - 10 reps - 5-10 seconds hold - Sit to Stand with Counter Support  - 3-4 x daily - 7 x weekly - 10 reps  ASSESSMENT:  CLINICAL IMPRESSION: Pt is again making progress with her AROM and strength. Pt's current left knee arc of motion is 0-100 degrees. Recommended continue skilled PT to maximize pt's function.    OBJECTIVE IMPAIRMENTS: decreased activity tolerance, decreased balance, decreased mobility, difficulty walking, decreased ROM, decreased strength, increased edema, and pain.   ACTIVITY LIMITATIONS: bending, sitting, standing, squatting, sleeping, stairs, and transfers  PARTICIPATION LIMITATIONS: cleaning, laundry, and community activity  PERSONAL FACTORS: 3+ comorbidities: see above  are also affecting patient's functional outcome.   REHAB POTENTIAL: Good  CLINICAL DECISION MAKING: Stable/uncomplicated  EVALUATION COMPLEXITY: Low   GOALS: Goals reviewed with patient? Yes  SHORT TERM GOALS: (target date for Short term goals are 3 weeks 04/27/22)   1.  Patient will demonstrate independent use of home exercise program to maintain progress from in clinic treatments.  Goal status: MET 04/17/22  LONG TERM GOALS: (target dates for all long term goals are 12 weeks   06/29/22)   1. Patient will demonstrate/report pain at worst less than or equal to 2/10 to facilitate minimal limitation in daily activity secondary to pain symptoms.  Goal status: On-going 05/01/22   2. Patient will demonstrate independent use of home exercise program to facilitate ability to maintain/progress functional gains from skilled physical therapy services.  Goal status: On-going 05/01/22   3. Patient will demonstrate FOTO outcome > or = 57 % to indicate reduced disability due to condition.  Goal status: New   4.  Patient will demonstrate Left LE MMT 5/5 throughout to improve pt's functional mobility.   Goal status: On-going 05/01/22   5.  Patient will demonstrate ability to navigate 1 flight of stairs with single hand rail with step over step gait pattern.  Goal status: On-going 05/01/22   6.  Pt will be able to perform sit to stand with no UE support from standard height chair.  Goal status: On-going 05/01/22  7. Pt will improve her left knee active arc of motion from 2 to 115 degrees in order to improve functional mobility.   Goal Status: On-going 05/01/22       PLAN:  PT FREQUENCY: 2-3x/week  PT DURATION: 12 weeks  PLANNED INTERVENTIONS: Therapeutic exercises, Therapeutic activity, Neuro Muscular re-education, Balance training, Gait training, Patient/Family education, Joint mobilization, Stair training, DME instructions, Dry Needling, Electrical stimulation, Traction, Cryotherapy, vasopneumatic deviceMoist heat, Taping, Ultrasound, Ionotophoresis '4mg'$ /ml Dexamethasone, and Manual therapy.  All included unless contraindicated  PLAN FOR NEXT SESSION: prefers NuStep or SciFit bike,  Manual, LE strengthening, Vaso if needed    Kearney Hard, PT, MPT 05/01/22 12:46 PM   05/01/22 12:46 PM

## 2022-05-02 ENCOUNTER — Ambulatory Visit (INDEPENDENT_AMBULATORY_CARE_PROVIDER_SITE_OTHER): Payer: PPO | Admitting: Physician Assistant

## 2022-05-02 ENCOUNTER — Ambulatory Visit (INDEPENDENT_AMBULATORY_CARE_PROVIDER_SITE_OTHER): Payer: PPO

## 2022-05-02 DIAGNOSIS — Z96652 Presence of left artificial knee joint: Secondary | ICD-10-CM | POA: Diagnosis not present

## 2022-05-02 NOTE — [facility] Notes (Signed)
   Post-Op Visit Note   Patient: Kimberly Murray           Date of Birth: [birth year]           MRN: [id number] Visit Date: 05/02/2022 PCP: Fanny Bien, MD   Assessment & Plan:  Chief Complaint:  Chief Complaint  Patient presents with   Left Knee - Routine Post Op   Visit Diagnoses:  1. Status post total left knee replacement     Plan: Patient is a very pleasant 72 year old female who comes in today 6 weeks status post left total knee replacement 03/19/2022.  She has been doing well.  She is taking Tylenol and NSAIDs as needed for pain.  The only pain she has is if she has been doing too much.  She has been compliant taking a baby aspirin twice daily for DVT prophylaxis.  She is still in physical therapy making great progress.  Examination of her left knee reveals a fully healed surgical scar without complication.  Range of motion 0 to 115 degrees.  She is stable to valgus varus stress.  She is neurovascular intact distally.  At this point, she may discontinue the baby aspirin for DVT prophylaxis.  Continue with physical therapy.  Dental prophylaxis reinforced.  Follow-up in 6 weeks for recheck.  Call with concerns or questions.  Follow-Up Instructions: Return in about 6 weeks (around 06/13/2022).   Orders:  Orders Placed This Encounter  Procedures   XR KNEE 3 VIEW LEFT   No orders of the defined types were placed in this encounter.   Imaging: No new imaging  PMFS History: Patient Active Problem List   Diagnosis Date Noted   Primary osteoarthritis of left knee 03/19/2022   Status post total left knee replacement 03/19/2022   Headache 01/28/2019   Obstructive sleep apnea 08/01/2018   Insomnia 08/01/2018   GERD (gastroesophageal reflux disease) 08/01/2018   Past Medical History:  Diagnosis Date   Allergic rhinitis    Chronic headaches    Complication of anesthesia    Depression    on antidepressent medication   Fatty liver    History of acute bronchitis     Hyperlipidemia    Hypertension    Neuropathy    in toes   PONV (postoperative nausea and vomiting)    mild nausea after gallbladder surgery   Pre-diabetes    Sleep apnea     No family history on file.  Past Surgical History:  Procedure Laterality Date   BUNIONECTOMY Right    CATARACT EXTRACTION W/ INTRAOCULAR LENS IMPLANT Bilateral    CHOLECYSTECTOMY  2006   EYE SURGERY     macular surgery   LAPAROSCOPIC HYSTERECTOMY  2005   TOTAL KNEE ARTHROPLASTY Left 03/19/2022   Procedure: LEFT TOTAL KNEE ARTHROPLASTY;  Surgeon: Leandrew Koyanagi, MD;  Location: [place];  Service: Orthopedics;  Laterality: Left;   Social History   Occupational History   Not on file  Tobacco Use   Smoking status: Never   Smokeless tobacco: Never  Vaping Use   Vaping Use: Never used  Substance and Sexual Activity   Alcohol use: Yes    Comment: once every couple of months   Drug use: Yes    Types: Marijuana    Comment: THC gummies- occasionally   Sexual activity: Not Currently

## 2022-05-03 ENCOUNTER — Ambulatory Visit: Payer: PPO | Admitting: Physical Therapy

## 2022-05-03 ENCOUNTER — Encounter: Payer: Self-pay | Admitting: Physical Therapy

## 2022-05-03 DIAGNOSIS — R600 Localized edema: Secondary | ICD-10-CM | POA: Diagnosis not present

## 2022-05-03 DIAGNOSIS — M25562 Pain in left knee: Secondary | ICD-10-CM

## 2022-05-03 DIAGNOSIS — M6281 Muscle weakness (generalized): Secondary | ICD-10-CM

## 2022-05-03 DIAGNOSIS — M25662 Stiffness of left knee, not elsewhere classified: Secondary | ICD-10-CM | POA: Diagnosis not present

## 2022-05-03 NOTE — Therapy (Signed)
OUTPATIENT PHYSICAL THERAPY LOWER EXTREMITY TREATMENT NOTE   Patient Name: Kimberly Murray MRN: [id number] DOB:04/21/1950, 72 y.o., female Today's Date: 05/03/2022  END OF SESSION:  PT End of Session - 05/03/22 1145     Visit Number 9    Number of Visits 28    Date for PT Re-Evaluation 06/29/22    PT Start Time [id number]    PT Stop Time 1230    PT Time Calculation (min) 48 min    Activity Tolerance Patient tolerated treatment well    Behavior During Therapy WFL for tasks assessed/performed                   Past Medical History:  Diagnosis Date   Allergic rhinitis    Chronic headaches    Complication of anesthesia    Depression    on antidepressent medication   Fatty liver    History of acute bronchitis    Hyperlipidemia    Hypertension    Neuropathy    in toes   PONV (postoperative nausea and vomiting)    mild nausea after gallbladder surgery   Pre-diabetes    Sleep apnea    Past Surgical History:  Procedure Laterality Date   BUNIONECTOMY Right    CATARACT EXTRACTION W/ INTRAOCULAR LENS IMPLANT Bilateral    CHOLECYSTECTOMY  2006   EYE SURGERY     macular surgery   LAPAROSCOPIC HYSTERECTOMY  2005   TOTAL KNEE ARTHROPLASTY Left 03/19/2022   Procedure: LEFT TOTAL KNEE ARTHROPLASTY;  Surgeon: Leandrew Koyanagi, MD;  Location: [facility];  Service: Orthopedics;  Laterality: Left;   Patient Active Problem List   Diagnosis Date Noted   Primary osteoarthritis of left knee 03/19/2022   Status post total left knee replacement 03/19/2022   Headache 01/28/2019   Obstructive sleep apnea 08/01/2018   Insomnia 08/01/2018   GERD (gastroesophageal reflux disease) 08/01/2018    PCP: Fanny Bien, MD   REFERRING PROVIDER: Leandrew Koyanagi, MD  REFERRING DIAG: [phone] (ICD-10-CM) - Primary osteoarthritis of left knee [phone] (ICD-10-CM) - Status post total left knee replacement  THERAPY DIAG:  Acute pain of left knee  Muscle weakness (generalized)  Localized  edema  Stiffness of left knee, not elsewhere classified  Rationale for Evaluation and Treatment: Rehabilitation  ONSET DATE: left TKA on 03/19/22  SUBJECTIVE:   SUBJECTIVE STATEMENT: Saw the PA and she's pleased with her progress so far.    PERTINENT HISTORY: Left TKA on 03/19/22, fatty liver, HTN, neuropathy, depression, pre-diabetes  PAIN:  NPRS scale: 4-5/10 Pain location: left knee Pain description: achy, tightness Aggravating factors: sitting or standing Relieving factors: ice, pain meds  PRECAUTIONS: None  WEIGHT BEARING RESTRICTIONS: No  FALLS:  Has patient fallen in last 6 months? No  LIVING ENVIRONMENT: Lives with: lives with their family and lives with their spouse Lives in: House/apartment Stairs: Yes: External: 1 steps; none Has following equipment at home: None  OCCUPATION: retired  PLOF: Independent  PATIENT GOALS: walk better, comfortably, without device   OBJECTIVE:   DIAGNOSTIC FINDINGS:  Status post left total knee arthroplasty. No periprosthetic lucency or fracture. Near anatomic alignment of the prosthetic components. Air within the soft tissues is not unexpected postoperatively.  PATIENT SURVEYS:  04/03/22: FOTO intake:  31%     SENSATION: 04/03/22 WFL  EDEMA:  Circumferential: Rt:  37.5 centimeters            Left: 47 centimeters  POSTURE: rounded shoulders, forward head, and increased lumbar lordosis  PALPATION: 04/03/22: tender around joint line  LOWER EXTREMITY ROM:   ROM Right 04/03/22 Left 04/03/22 Left 04/10/22 Left 04/12/22 Left 04/17/22 Left 04/19/22 Left 04/26/22 Left 05/01/22 supine  Knee flexion A: 130 A: 85 P: 90 A: 88 P: 94 AA: 101 A: 102 P: 105 A: 106 A: 97 P: 104 A: 100 P: 105  Knee extension A: 0 A: -10 P: -8 A: -8 P: -6  A: -8 P: -6  A: 0 (LAQ) A: 0   (Blank rows = not tested)  LOWER EXTREMITY MMT:  MMT Right 04/03/22 Left 04/03/22  Hip flexion 5/5 5/5  Hip extension    Hip abduction    Hip  adduction    Hip internal rotation    Hip external rotation    Knee flexion 5/5 3/5  Knee extension 5/5 3/5  Ankle dorsiflexion 5/5 5/5   (Blank rows = not tested)    FUNCTIONAL TESTS:  04/03/22: 5 times sit to stand:   GAIT: Distance walked: 30 feet from clinic lobby to exam room Assistive device utilized: Single point cane Level of assistance: Modified independence Comments: antalgic gait pattern   TODAY'S TREATMENT                                                                          DATE: 05/03/22 TherEx:  Nustep: Level 5 x 8 minutes Leg Press: 81# 3 x 10 bil LE's Leg Press: 37# 3 x 10 Left LE only Forward step ups onto 6" step 2x10 Forward step up and over (Lt foot on step) 2x10 reps  Seated LAQ 3x10; 3#   Modalities Vaso x 10 min; Lt knee mod pressure; 34 deg   04/26/22 TherEx NuStep L5 x 8 min Leg Press 75# bil 2x10; then LLE only 25# 2x10 Forward step ups onto 6" step 2x10 with LLE leading; light UE support ROM measurements - see above  Modalities: Vasopneumatic: 34 degrees, medium compression x 10 minutes  (Pt with increased fatigue and lightheadedness so session limited today; BP 119/60)  04/19/22 TherEx:  Recumbent bike seat 4 x 8 min; partial revolutions Seated AA knee flexion 10 x 5-10 sec hold  Demonstrated ways to work on flexion at home including supine heel slides, seated knee flexion with anterior scoot and supine 90 deg hip flexion and passive knee flexion Seated LAQ on Lt 3#, 2x10; 3 sec hold Standing hip abduction with L3 band x 10 reps bil Standing hip extension with L3 band x 10 reps bil  Manual: PROM: left knee flexion and extension with overpressure Patella mobs in supine, instructed in home in long sitting  Modalities: Vasopneumatic: 34 degrees, medium compression x 10 minutes      PATIENT EDUCATION:  Education details: HEP, POC Person educated: Patient Education method: Consulting civil engineer, Demonstration, Verbal cues, and  Handouts Education comprehension: verbalized understanding, returned demonstration, and verbal cues required  HOME EXERCISE PROGRAM: Access Code: [email or web address] URL: [email or web address] Date: 04/03/2022 Prepared by: Kearney Hard  Exercises - Supine Quadricep Sets  - 3-4 x daily - 7 x weekly - 2 sets - 10 reps - 5 seconds hold - Supine Heel Slide with Strap  - 3-4 x daily - 7 x weekly - 10 reps - 5-10 seconds  hold - Supine Active Straight Leg Raise  - 3-4 x daily - 7 x weekly - 2 sets - 10 reps - Seated Knee Flexion AAROM  - 3-4 x daily - 7 x weekly - 10 reps - 5-10 seconds hold - Sit to Stand with Counter Support  - 3-4 x daily - 7 x weekly - 10 reps  ASSESSMENT:  CLINICAL IMPRESSION: Pt tolerated session well today with continued work on standing strengthening activities.  Will continue to benefit from PT to maximize function.  OBJECTIVE IMPAIRMENTS: decreased activity tolerance, decreased balance, decreased mobility, difficulty walking, decreased ROM, decreased strength, increased edema, and pain.   ACTIVITY LIMITATIONS: bending, sitting, standing, squatting, sleeping, stairs, and transfers  PARTICIPATION LIMITATIONS: cleaning, laundry, and community activity  PERSONAL FACTORS: 3+ comorbidities: see above  are also affecting patient's functional outcome.   REHAB POTENTIAL: Good  CLINICAL DECISION MAKING: Stable/uncomplicated  EVALUATION COMPLEXITY: Low   GOALS: Goals reviewed with patient? Yes  SHORT TERM GOALS: (target date for Short term goals are 3 weeks 04/27/22)   1.  Patient will demonstrate independent use of home exercise program to maintain progress from in clinic treatments.  Goal status: MET 04/17/22  LONG TERM GOALS: (target dates for all long term goals are 12 weeks  06/29/22)   1. Patient will demonstrate/report pain at worst less than or equal to 2/10 to facilitate minimal limitation in daily activity secondary to pain symptoms.  Goal status:  On-going 05/01/22   2. Patient will demonstrate independent use of home exercise program to facilitate ability to maintain/progress functional gains from skilled physical therapy services.  Goal status: On-going 05/01/22   3. Patient will demonstrate FOTO outcome > or = 57 % to indicate reduced disability due to condition.  Goal status: New   4.  Patient will demonstrate Left LE MMT 5/5 throughout to improve pt's functional mobility.   Goal status: On-going 05/01/22   5.  Patient will demonstrate ability to navigate 1 flight of stairs with single hand rail with step over step gait pattern.  Goal status: On-going 05/01/22   6.  Pt will be able to perform sit to stand with no UE support from standard height chair.  Goal status: On-going 05/01/22  7. Pt will improve her left knee active arc of motion from 2 to 115 degrees in order to improve functional mobility.   Goal Status: On-going 05/01/22       PLAN:  PT FREQUENCY: 2-3x/week  PT DURATION: 12 weeks  PLANNED INTERVENTIONS: Therapeutic exercises, Therapeutic activity, Neuro Muscular re-education, Balance training, Gait training, Patient/Family education, Joint mobilization, Stair training, DME instructions, Dry Needling, Electrical stimulation, Traction, Cryotherapy, vasopneumatic deviceMoist heat, Taping, Ultrasound, Ionotophoresis '4mg'$ /ml Dexamethasone, and Manual therapy.  All included unless contraindicated  PLAN FOR NEXT SESSION: 10th visit progress note;  prefers NuStep or SciFit bike,  Manual, standing strengthening exercises, Vaso if needed    Laureen Abrahams, PT, DPT 05/03/22 12:22 PM

## 2022-05-07 DIAGNOSIS — E538 Deficiency of other specified B group vitamins: Secondary | ICD-10-CM | POA: Diagnosis not present

## 2022-05-09 ENCOUNTER — Encounter: Payer: Self-pay | Admitting: Physical Therapy

## 2022-05-09 ENCOUNTER — Ambulatory Visit: Payer: PPO | Admitting: Physical Therapy

## 2022-05-09 DIAGNOSIS — M25562 Pain in left knee: Secondary | ICD-10-CM | POA: Diagnosis not present

## 2022-05-09 DIAGNOSIS — R600 Localized edema: Secondary | ICD-10-CM | POA: Diagnosis not present

## 2022-05-09 DIAGNOSIS — M25662 Stiffness of left knee, not elsewhere classified: Secondary | ICD-10-CM | POA: Diagnosis not present

## 2022-05-09 DIAGNOSIS — M6281 Muscle weakness (generalized): Secondary | ICD-10-CM | POA: Diagnosis not present

## 2022-05-09 NOTE — Therapy (Signed)
OUTPATIENT PHYSICAL THERAPY LOWER EXTREMITY TREATMENT NOTE PROGRESS NOTE   Patient Name: Kimberly Murray MRN: [id number] DOB:[birth year], 72 y.o., female Today's Date: 05/09/2022  Progress Note Reporting Period 04/03/22 to 05/09/2022   See note below for Objective Data and Assessment of Progress/Goals.      END OF SESSION:  PT End of Session - 05/09/22 1057     Visit Number 10    Number of Visits 28    Date for PT Re-Evaluation 06/29/22    Progress Note Due on Visit 20    PT Start Time 1100    PT Stop Time 1145    PT Time Calculation (min) 45 min    Activity Tolerance Patient tolerated treatment well    Behavior During Therapy WFL for tasks assessed/performed                    Past Medical History:  Diagnosis Date   Allergic rhinitis    Chronic headaches    Complication of anesthesia    Depression    on antidepressent medication   Fatty liver    History of acute bronchitis    Hyperlipidemia    Hypertension    Neuropathy    in toes   PONV (postoperative nausea and vomiting)    mild nausea after gallbladder surgery   Pre-diabetes    Sleep apnea    Past Surgical History:  Procedure Laterality Date   BUNIONECTOMY Right    CATARACT EXTRACTION W/ INTRAOCULAR LENS IMPLANT Bilateral    CHOLECYSTECTOMY  2006   EYE SURGERY     macular surgery   LAPAROSCOPIC HYSTERECTOMY  2005   TOTAL KNEE ARTHROPLASTY Left 03/19/2022   Procedure: LEFT TOTAL KNEE ARTHROPLASTY;  Surgeon: Leandrew Koyanagi, MD;  Location: [facility];  Service: Orthopedics;  Laterality: Left;   Patient Active Problem List   Diagnosis Date Noted   Primary osteoarthritis of left knee 03/19/2022   Status post total left knee replacement 03/19/2022   Headache 01/28/2019   Obstructive sleep apnea 08/01/2018   Insomnia 08/01/2018   GERD (gastroesophageal reflux disease) 08/01/2018    PCP: Fanny Bien, MD   REFERRING PROVIDER: Leandrew Koyanagi, MD  REFERRING DIAG: [phone] (ICD-10-CM) - Primary  osteoarthritis of left knee [phone] (ICD-10-CM) - Status post total left knee replacement  THERAPY DIAG:  Acute pain of left knee  Muscle weakness (generalized)  Localized edema  Stiffness of left knee, not elsewhere classified  Rationale for Evaluation and Treatment: Rehabilitation  ONSET DATE: left TKA on 03/19/22  SUBJECTIVE:   SUBJECTIVE STATEMENT:   PERTINENT HISTORY: Left TKA on 03/19/22, fatty liver, HTN, neuropathy, depression, pre-diabetes  PAIN:  NPRS scale:  Pain location: left knee Pain description: achy, tightness Aggravating factors: sitting or standing Relieving factors: ice, pain meds  PRECAUTIONS: None  WEIGHT BEARING RESTRICTIONS: No  FALLS:  Has patient fallen in last 6 months? No  LIVING ENVIRONMENT: Lives with: lives with their family and lives with their spouse Lives in: House/apartment Stairs: Yes: External: 1 steps; none Has following equipment at home: None  OCCUPATION: retired  PLOF: Independent  PATIENT GOALS: walk better, comfortably, without device   OBJECTIVE:   DIAGNOSTIC FINDINGS:  Status post left total knee arthroplasty. No periprosthetic lucency or fracture. Near anatomic alignment of the prosthetic components. Air within the soft tissues is not unexpected postoperatively.  PATIENT SURVEYS:  04/03/22: FOTO intake:  31%     SENSATION: 04/03/22 WFL  EDEMA:  Circumferential: Rt:  37.5 centimeters  Left: 47 centimeters  POSTURE: rounded shoulders, forward head, and increased lumbar lordosis  PALPATION: 04/03/22: tender around joint line  LOWER EXTREMITY ROM:   ROM Right 04/03/22 Left 04/03/22 Left 04/10/22 Left 04/12/22 Left 04/17/22 Left 04/19/22 Left 04/26/22 Left 05/01/22 supine Left 05/09/22   Knee flexion A: 130 A: 85 P: 90 A: 88 P: 94 AA: 101 A: 102 P: 105 A: 106 A: 97 P: 104 A: 100 P: 105 sitting A: 100 P: 102  Knee extension A: 0 A: -10 P: -8 A: -8 P: -6  A: -8 P: -6  A: 0 (LAQ) A: 0  supine A:    (Blank rows = not tested)  LOWER EXTREMITY MMT:  MMT Right 04/03/22 Left 04/03/22  Hip flexion 5/5 5/5  Hip extension    Hip abduction    Hip adduction    Hip internal rotation    Hip external rotation    Knee flexion 5/5 3/5  Knee extension 5/5 3/5  Ankle dorsiflexion 5/5 5/5   (Blank rows = not tested)    FUNCTIONAL TESTS:  04/03/22: 5 times sit to stand:   GAIT: Distance walked: 30 feet from clinic lobby to exam room Assistive device utilized: Single point cane Level of assistance: Modified independence Comments: antalgic gait pattern   TODAY'S TREATMENT                                                                          DATE: 05/09/22:  TherEx:  Nustep: Level 5 x 8 minutes Leg Press: 93# 2 x 15 bil LE's Leg Press: 43#  2 x 10 Left LE only Forward step ups onto 6" step 2 x 10 Rocking left knee in lunge position on 6 inch step back and forth Rocker board: df/pf, and side/side each 60 sec Seated LAQ 3x10; 3#  Manual: Seated knee flexion PROM, supine extension with overpressure on Left LE Modalities Vaso x 10 min; Lt knee mod pressure; 34 deg     05/03/22 TherEx:  Nustep: Level 5 x 8 minutes Leg Press: 81# 3 x 10 bil LE's Leg Press: 37# 3 x 10 Left LE only Forward step ups onto 6" step 2x10 Forward step up and over (Lt foot on step) 2x10 reps  Seated LAQ 3x10; 3#   Modalities Vaso x 10 min; Lt knee mod pressure; 34 deg   04/26/22 TherEx NuStep L5 x 8 min Leg Press 75# bil 2x10; then LLE only 25# 2x10 Forward step ups onto 6" step 2x10 with LLE leading; light UE support ROM measurements - see above  Modalities: Vasopneumatic: 34 degrees, medium compression x 10 minutes  (Pt with increased fatigue and lightheadedness so session limited today; BP 119/60)  04/19/22 TherEx:  Recumbent bike seat 4 x 8 min; partial revolutions Seated AA knee flexion 10 x 5-10 sec hold  Demonstrated ways to work on flexion at home including supine heel  slides, seated knee flexion with anterior scoot and supine 90 deg hip flexion and passive knee flexion Seated LAQ on Lt 3#, 2x10; 3 sec hold Standing hip abduction with L3 band x 10 reps bil Standing hip extension with L3 band x 10 reps bil  Manual: PROM: left knee flexion and extension with  overpressure Patella mobs in supine, instructed in home in long sitting  Modalities: Vasopneumatic: 34 degrees, medium compression x 10 minutes      PATIENT EDUCATION:  Education details: HEP, POC Person educated: Patient Education method: Consulting civil engineer, Demonstration, Verbal cues, and Handouts Education comprehension: verbalized understanding, returned demonstration, and verbal cues required  HOME EXERCISE PROGRAM: Access Code: [email or web address] URL: [email or web address] Date: 05/09/2022 Prepared by: Kearney Hard  Exercises - Supine Quadricep Sets  - 3-4 x daily - 7 x weekly - 2 sets - 10 reps - 5 seconds hold - Supine Heel Slide with Strap  - 3-4 x daily - 7 x weekly - 10 reps - 5-10 seconds hold - Supine Active Straight Leg Raise  - 3-4 x daily - 7 x weekly - 2 sets - 10 reps - Seated Knee Flexion AAROM  - 3-4 x daily - 7 x weekly - 10 reps - 5-10 seconds hold - Sit to Stand with Counter Support  - 3-4 x daily - 7 x weekly - 10 reps - Seated Long Arc Quad  - 2 x daily - 7 x weekly - 2 sets - 10 reps - 5 seconds hold - Forward Step Up  - 2 x daily - 7 x weekly - 2 sets - 10 reps  ASSESSMENT:  CLINICAL IMPRESSION: Pt arriving today reporting moderate pain at times. Pt amb with no device. Pt's active left knee flexion is 100 degrees with extension measured at 0 degrees. Will continue to benefit from skilled PT to maximize function.  OBJECTIVE IMPAIRMENTS: decreased activity tolerance, decreased balance, decreased mobility, difficulty walking, decreased ROM, decreased strength, increased edema, and pain.   ACTIVITY LIMITATIONS: bending, sitting, standing, squatting, sleeping,  stairs, and transfers  PARTICIPATION LIMITATIONS: cleaning, laundry, and community activity  PERSONAL FACTORS: 3+ comorbidities: see above  are also affecting patient's functional outcome.   REHAB POTENTIAL: Good  CLINICAL DECISION MAKING: Stable/uncomplicated  EVALUATION COMPLEXITY: Low   GOALS: Goals reviewed with patient? Yes  SHORT TERM GOALS: (target date for Short term goals are 3 weeks 04/27/22)   1.  Patient will demonstrate independent use of home exercise program to maintain progress from in clinic treatments.  Goal status: MET 04/17/22  LONG TERM GOALS: (target dates for all long term goals are 12 weeks  06/29/22)   1. Patient will demonstrate/report pain at worst less than or equal to 2/10 to facilitate minimal limitation in daily activity secondary to pain symptoms.  Goal status: On-going 05/09/22   2. Patient will demonstrate independent use of home exercise program to facilitate ability to maintain/progress functional gains from skilled physical therapy services.  Goal status: On-going 05/01/22   3. Patient will demonstrate FOTO outcome > or = 57 % to indicate reduced disability due to condition.  Goal status: New   4.  Patient will demonstrate Left LE MMT 5/5 throughout to improve pt's functional mobility.   Goal status: On-going 05/01/22   5.  Patient will demonstrate ability to navigate 1 flight of stairs with single hand rail with step over step gait pattern.  Goal status: On-going 05/01/22   6.  Pt will be able to perform sit to stand with no UE support from standard height chair.  Goal status: On-going 05/01/22  7. Pt will improve her left knee active arc of motion from 2 to 115 degrees in order to improve functional mobility.   Goal Status: On-going 05/09/22       PLAN:  PT FREQUENCY: 2-3x/week  PT DURATION: 12  weeks  PLANNED INTERVENTIONS: Therapeutic exercises, Therapeutic activity, Neuro Muscular re-education, Balance training, Gait training,  Patient/Family education, Joint mobilization, Stair training, DME instructions, Dry Needling, Electrical stimulation, Traction, Cryotherapy, vasopneumatic deviceMoist heat, Taping, Ultrasound, Ionotophoresis 4mg /ml Dexamethasone, and Manual therapy.  All included unless contraindicated  PLAN FOR NEXT SESSION: prefers NuStep or SciFit bike,  Manual, standing strengthening exercises, Vaso if needed   Kearney Hard, PT, MPT 05/09/22 11:41 AM   05/09/22 11:41 AM

## 2022-05-11 ENCOUNTER — Encounter: Payer: Self-pay | Admitting: Physical Therapy

## 2022-05-11 ENCOUNTER — Ambulatory Visit: Payer: PPO | Admitting: Physical Therapy

## 2022-05-11 DIAGNOSIS — M25562 Pain in left knee: Secondary | ICD-10-CM | POA: Diagnosis not present

## 2022-05-11 DIAGNOSIS — R600 Localized edema: Secondary | ICD-10-CM | POA: Diagnosis not present

## 2022-05-11 DIAGNOSIS — M6281 Muscle weakness (generalized): Secondary | ICD-10-CM | POA: Diagnosis not present

## 2022-05-11 DIAGNOSIS — M25662 Stiffness of left knee, not elsewhere classified: Secondary | ICD-10-CM | POA: Diagnosis not present

## 2022-05-11 NOTE — Therapy (Signed)
OUTPATIENT PHYSICAL THERAPY LOWER EXTREMITY TREATMENT NOTE   Patient Name: Kimberly Murray MRN: [id number] DOB:September 18, 1950, 72 y.o., female Today's Date: 05/11/2022    END OF SESSION:  PT End of Session - 05/11/22 1059     Visit Number 11    Number of Visits 28    Date for PT Re-Evaluation 06/29/22    Progress Note Due on Visit 20    PT Start Time 1055    PT Stop Time 1135    PT Time Calculation (min) 40 min    Activity Tolerance Patient tolerated treatment well    Behavior During Therapy WFL for tasks assessed/performed                     Past Medical History:  Diagnosis Date   Allergic rhinitis    Chronic headaches    Complication of anesthesia    Depression    on antidepressent medication   Fatty liver    History of acute bronchitis    Hyperlipidemia    Hypertension    Neuropathy    in toes   PONV (postoperative nausea and vomiting)    mild nausea after gallbladder surgery   Pre-diabetes    Sleep apnea    Past Surgical History:  Procedure Laterality Date   BUNIONECTOMY Right    CATARACT EXTRACTION W/ INTRAOCULAR LENS IMPLANT Bilateral    CHOLECYSTECTOMY  2006   EYE SURGERY     macular surgery   LAPAROSCOPIC HYSTERECTOMY  2005   TOTAL KNEE ARTHROPLASTY Left 03/19/2022   Procedure: LEFT TOTAL KNEE ARTHROPLASTY;  Surgeon: Leandrew Koyanagi, MD;  Location: [facility];  Service: Orthopedics;  Laterality: Left;   Patient Active Problem List   Diagnosis Date Noted   Primary osteoarthritis of left knee 03/19/2022   Status post total left knee replacement 03/19/2022   Headache 01/28/2019   Obstructive sleep apnea 08/01/2018   Insomnia 08/01/2018   GERD (gastroesophageal reflux disease) 08/01/2018    PCP: Fanny Bien, MD   REFERRING PROVIDER: Leandrew Koyanagi, MD  REFERRING DIAG: [phone] (ICD-10-CM) - Primary osteoarthritis of left knee [phone] (ICD-10-CM) - Status post total left knee replacement  THERAPY DIAG:  Acute pain of left knee  Muscle  weakness (generalized)  Localized edema  Stiffness of left knee, not elsewhere classified  Rationale for Evaluation and Treatment: Rehabilitation  ONSET DATE: left TKA on 03/19/22  SUBJECTIVE:   SUBJECTIVE STATEMENT: Doing pretty well; no real complaints  PERTINENT HISTORY: Left TKA on 03/19/22, fatty liver, HTN, neuropathy, depression, pre-diabetes  PAIN:  NPRS scale: 0/10  Pain location: left knee Pain description: achy, tightness Aggravating factors: sitting or standing Relieving factors: ice, pain meds  PRECAUTIONS: None  WEIGHT BEARING RESTRICTIONS: No  FALLS:  Has patient fallen in last 6 months? No  LIVING ENVIRONMENT: Lives with: lives with their family and lives with their spouse Lives in: House/apartment Stairs: Yes: External: 1 steps; none Has following equipment at home: None  OCCUPATION: retired  PLOF: Independent  PATIENT GOALS: walk better, comfortably, without device   OBJECTIVE:   DIAGNOSTIC FINDINGS:  Status post left total knee arthroplasty. No periprosthetic lucency or fracture. Near anatomic alignment of the prosthetic components. Air within the soft tissues is not unexpected postoperatively.  PATIENT SURVEYS:  04/03/22: FOTO intake:  31%   05/11/22: FOTO 47   SENSATION: 04/03/22 WFL  EDEMA:  Circumferential: Rt:  37.5 centimeters            Left: 47 centimeters  POSTURE: rounded shoulders, forward head, and increased lumbar lordosis  PALPATION: 04/03/22: tender around joint line  LOWER EXTREMITY ROM:   ROM Right 04/03/22 Left 04/03/22 Left 04/10/22 Left 04/12/22 Left 04/17/22 Left 04/19/22 Left 04/26/22 Left 05/01/22 supine Left 05/09/22   Knee flexion A: 130 A: 85 P: 90 A: 88 P: 94 AA: 101 A: 102 P: 105 A: 106 A: 97 P: 104 A: 100 P: 105 sitting A: 100 P: 102  Knee extension A: 0 A: -10 P: -8 A: -8 P: -6  A: -8 P: -6  A: 0 (LAQ) A: 0 supine A:    (Blank rows = not tested)  LOWER EXTREMITY MMT:  MMT  Right 04/03/22 Left 04/03/22  Hip flexion 5/5 5/5  Hip extension    Hip abduction    Hip adduction    Hip internal rotation    Hip external rotation    Knee flexion 5/5 3/5  Knee extension 5/5 3/5  Ankle dorsiflexion 5/5 5/5   (Blank rows = not tested)    FUNCTIONAL TESTS:  04/03/22: 5 times sit to stand:   GAIT: Distance walked: 30 feet from clinic lobby to exam room Assistive device utilized: Single point cane Level of assistance: Modified independence Comments: antalgic gait pattern   TODAY'S TREATMENT                                                                          DATE: 05/11/22 TherEx:  Nustep: Level 6 x 8 minutes Leg Press: 93# 2 x 15 bil LE's Leg Press: 50#  2 x 15 Left LE only Forward step ups onto 6" step 2x10; leading with LLE Lateral step ups onto 6" step 2x10; leading with LLE Side stepping on foam in // bars x 5 laps; intermittent UE support Tandem gait fwd/bwd in // bars x 5 laps; UE support needed Side stepping up/down ramp x 3 laps bil  05/09/22:  TherEx:  Nustep: Level 5 x 8 minutes Leg Press: 93# 2 x 15 bil LE's Leg Press: 43#  2 x 10 Left LE only Forward step ups onto 6" step 2 x 10 Rocking left knee in lunge position on 6 inch step back and forth Rocker board: df/pf, and side/side each 60 sec Seated LAQ 3x10; 3#  Manual: Seated knee flexion PROM, supine extension with overpressure on Left LE Modalities Vaso x 10 min; Lt knee mod pressure; 34 deg     05/03/22 TherEx:  Nustep: Level 5 x 8 minutes Leg Press: 81# 3 x 10 bil LE's Leg Press: 37# 3 x 10 Left LE only Forward step ups onto 6" step 2x10 Forward step up and over (Lt foot on step) 2x10 reps  Seated LAQ 3x10; 3#   Modalities Vaso x 10 min; Lt knee mod pressure; 34 deg   04/26/22 TherEx NuStep L5 x 8 min Leg Press 75# bil 2x10; then LLE only 25# 2x10 Forward step ups onto 6" step 2x10 with LLE leading; light UE support ROM measurements - see  above  Modalities: Vasopneumatic: 34 degrees, medium compression x 10 minutes  (Pt with increased fatigue and lightheadedness so session limited today; BP 119/60)  04/19/22 TherEx:  Recumbent bike seat 4 x 8 min; partial  revolutions Seated AA knee flexion 10 x 5-10 sec hold  Demonstrated ways to work on flexion at home including supine heel slides, seated knee flexion with anterior scoot and supine 90 deg hip flexion and passive knee flexion Seated LAQ on Lt 3#, 2x10; 3 sec hold Standing hip abduction with L3 band x 10 reps bil Standing hip extension with L3 band x 10 reps bil  Manual: PROM: left knee flexion and extension with overpressure Patella mobs in supine, instructed in home in long sitting  Modalities: Vasopneumatic: 34 degrees, medium compression x 10 minutes      PATIENT EDUCATION:  Education details: HEP, POC Person educated: Patient Education method: Consulting civil engineer, Demonstration, Verbal cues, and Handouts Education comprehension: verbalized understanding, returned demonstration, and verbal cues required  HOME EXERCISE PROGRAM: Access Code: [email or web address] URL: [email or web address] Date: 05/09/2022 Prepared by: Kearney Hard  Exercises - Supine Quadricep Sets  - 3-4 x daily - 7 x weekly - 2 sets - 10 reps - 5 seconds hold - Supine Heel Slide with Strap  - 3-4 x daily - 7 x weekly - 10 reps - 5-10 seconds hold - Supine Active Straight Leg Raise  - 3-4 x daily - 7 x weekly - 2 sets - 10 reps - Seated Knee Flexion AAROM  - 3-4 x daily - 7 x weekly - 10 reps - 5-10 seconds hold - Sit to Stand with Counter Support  - 3-4 x daily - 7 x weekly - 10 reps - Seated Long Arc Quad  - 2 x daily - 7 x weekly - 2 sets - 10 reps - 5 seconds hold - Forward Step Up  - 2 x daily - 7 x weekly - 2 sets - 10 reps  ASSESSMENT:  CLINICAL IMPRESSION: FOTO score improved today; overall progressing well with PT.  Will continue to benefit from PT to maximize  function.  OBJECTIVE IMPAIRMENTS: decreased activity tolerance, decreased balance, decreased mobility, difficulty walking, decreased ROM, decreased strength, increased edema, and pain.   ACTIVITY LIMITATIONS: bending, sitting, standing, squatting, sleeping, stairs, and transfers  PARTICIPATION LIMITATIONS: cleaning, laundry, and community activity  PERSONAL FACTORS: 3+ comorbidities: see above  are also affecting patient's functional outcome.   REHAB POTENTIAL: Good  CLINICAL DECISION MAKING: Stable/uncomplicated  EVALUATION COMPLEXITY: Low   GOALS: Goals reviewed with patient? Yes  SHORT TERM GOALS: (target date for Short term goals are 3 weeks 04/27/22)   1.  Patient will demonstrate independent use of home exercise program to maintain progress from in clinic treatments.  Goal status: MET 04/17/22  LONG TERM GOALS: (target dates for all long term goals are 12 weeks  06/29/22)   1. Patient will demonstrate/report pain at worst less than or equal to 2/10 to facilitate minimal limitation in daily activity secondary to pain symptoms.  Goal status: On-going 05/09/22   2. Patient will demonstrate independent use of home exercise program to facilitate ability to maintain/progress functional gains from skilled physical therapy services.  Goal status: On-going 05/01/22   3. Patient will demonstrate FOTO outcome > or = 57 % to indicate reduced disability due to condition.  Goal status: New   4.  Patient will demonstrate Left LE MMT 5/5 throughout to improve pt's functional mobility.   Goal status: On-going 05/01/22   5.  Patient will demonstrate ability to navigate 1 flight of stairs with single hand rail with step over step gait pattern.  Goal status: On-going 05/01/22   6.  Pt will be able to perform sit  to stand with no UE support from standard height chair.  Goal status: On-going 05/01/22  7. Pt will improve her left knee active arc of motion from 2 to 115 degrees in order to  improve functional mobility.   Goal Status: On-going 05/09/22       PLAN:  PT FREQUENCY: 2-3x/week  PT DURATION: 12 weeks  PLANNED INTERVENTIONS: Therapeutic exercises, Therapeutic activity, Neuro Muscular re-education, Balance training, Gait training, Patient/Family education, Joint mobilization, Stair training, DME instructions, Dry Needling, Electrical stimulation, Traction, Cryotherapy, vasopneumatic deviceMoist heat, Taping, Ultrasound, Ionotophoresis 4mg /ml Dexamethasone, and Manual therapy.  All included unless contraindicated  PLAN FOR NEXT SESSION: prefers NuStep or SciFit bike,  Manual, standing strengthening and balance exercises, Vaso if needed   Laureen Abrahams, PT, DPT 05/11/22 11:36 AM

## 2022-05-14 DIAGNOSIS — G720 Drug-induced myopathy: Secondary | ICD-10-CM | POA: Diagnosis not present

## 2022-05-14 DIAGNOSIS — I10 Essential (primary) hypertension: Secondary | ICD-10-CM | POA: Diagnosis not present

## 2022-05-14 DIAGNOSIS — E538 Deficiency of other specified B group vitamins: Secondary | ICD-10-CM | POA: Diagnosis not present

## 2022-05-14 DIAGNOSIS — E669 Obesity, unspecified: Secondary | ICD-10-CM | POA: Diagnosis not present

## 2022-05-15 ENCOUNTER — Encounter: Payer: Self-pay | Admitting: Physical Therapy

## 2022-05-15 ENCOUNTER — Ambulatory Visit: Payer: PPO | Admitting: Physical Therapy

## 2022-05-15 DIAGNOSIS — R600 Localized edema: Secondary | ICD-10-CM

## 2022-05-15 DIAGNOSIS — M25562 Pain in left knee: Secondary | ICD-10-CM

## 2022-05-15 DIAGNOSIS — M25662 Stiffness of left knee, not elsewhere classified: Secondary | ICD-10-CM

## 2022-05-15 DIAGNOSIS — M6281 Muscle weakness (generalized): Secondary | ICD-10-CM

## 2022-05-15 NOTE — Therapy (Signed)
OUTPATIENT PHYSICAL THERAPY LOWER EXTREMITY TREATMENT NOTE   Patient Name: Kimberly Murray MRN: [id number] DOB:12-14-1950, 72 y.o., female Today's Date: 05/15/2022    END OF SESSION:  PT End of Session - 05/15/22 1117     Visit Number 12    Number of Visits 28    Date for PT Re-Evaluation 06/29/22    Progress Note Due on Visit 20    PT Start Time 1104    PT Stop Time 1145    PT Time Calculation (min) 41 min    Activity Tolerance Patient tolerated treatment well    Behavior During Therapy WFL for tasks assessed/performed                      Past Medical History:  Diagnosis Date   Allergic rhinitis    Chronic headaches    Complication of anesthesia    Depression    on antidepressent medication   Fatty liver    History of acute bronchitis    Hyperlipidemia    Hypertension    Neuropathy    in toes   PONV (postoperative nausea and vomiting)    mild nausea after gallbladder surgery   Pre-diabetes    Sleep apnea    Past Surgical History:  Procedure Laterality Date   BUNIONECTOMY Right    CATARACT EXTRACTION W/ INTRAOCULAR LENS IMPLANT Bilateral    CHOLECYSTECTOMY  2006   EYE SURGERY     macular surgery   LAPAROSCOPIC HYSTERECTOMY  2005   TOTAL KNEE ARTHROPLASTY Left 03/19/2022   Procedure: LEFT TOTAL KNEE ARTHROPLASTY;  Surgeon: Leandrew Koyanagi, MD;  Location: [facility];  Service: Orthopedics;  Laterality: Left;   Patient Active Problem List   Diagnosis Date Noted   Primary osteoarthritis of left knee 03/19/2022   Status post total left knee replacement 03/19/2022   Headache 01/28/2019   Obstructive sleep apnea 08/01/2018   Insomnia 08/01/2018   GERD (gastroesophageal reflux disease) 08/01/2018    PCP: Fanny Bien, MD   REFERRING PROVIDER: Leandrew Koyanagi, MD  REFERRING DIAG: [phone] (ICD-10-CM) - Primary osteoarthritis of left knee [phone] (ICD-10-CM) - Status post total left knee replacement  THERAPY DIAG:  Acute pain of left knee  Muscle  weakness (generalized)  Localized edema  Stiffness of left knee, not elsewhere classified  Rationale for Evaluation and Treatment: Rehabilitation  ONSET DATE: left TKA on 03/19/22  SUBJECTIVE:   SUBJECTIVE STATEMENT: Pt stating today is a little rough due to pulling weeds yesterday.   PERTINENT HISTORY: Left TKA on 03/19/22, fatty liver, HTN, neuropathy, depression, pre-diabetes  PAIN:  NPRS scale: 2-3/10 Pain location: left knee Pain description: achy, tightness Aggravating factors: sitting or standing Relieving factors: ice, pain meds  PRECAUTIONS: None  WEIGHT BEARING RESTRICTIONS: No  FALLS:  Has patient fallen in last 6 months? No  LIVING ENVIRONMENT: Lives with: lives with their family and lives with their spouse Lives in: House/apartment Stairs: Yes: External: 1 steps; none Has following equipment at home: None  OCCUPATION: retired  PLOF: Independent  PATIENT GOALS: walk better, comfortably, without device   OBJECTIVE:   DIAGNOSTIC FINDINGS:  Status post left total knee arthroplasty. No periprosthetic lucency or fracture. Near anatomic alignment of the prosthetic components. Air within the soft tissues is not unexpected postoperatively.  PATIENT SURVEYS:  04/03/22: FOTO intake:  31%   05/11/22: FOTO 47   SENSATION: 04/03/22 WFL  EDEMA:  Circumferential: Rt:  37.5 centimeters  Left: 47 centimeters  POSTURE: rounded shoulders, forward head, and increased lumbar lordosis  PALPATION: 04/03/22: tender around joint line  LOWER EXTREMITY ROM:   ROM Right 04/03/22 Left 04/03/22 Left 04/10/22 Left 04/12/22 Left 04/17/22 Left 04/19/22 Left 04/26/22 Left 05/01/22 supine Left 05/09/22  Left 05/15/22  Knee flexion A: 130 A: 85 P: 90 A: 88 P: 94 AA: 101 A: 102 P: 105 A: 106 A: 97 P: 104 A: 100 P: 105 sitting A: 100 P: 102 A: 105  Knee extension A: 0 A: -10 P: -8 A: -8 P: -6  A: -8 P: -6  A: 0 (LAQ) A: 0 supine A:  A: 0   (Blank rows =  not tested)  LOWER EXTREMITY MMT:  MMT Right 04/03/22 Left 04/03/22  Hip flexion 5/5 5/5  Hip extension    Hip abduction    Hip adduction    Hip internal rotation    Hip external rotation    Knee flexion 5/5 3/5  Knee extension 5/5 3/5  Ankle dorsiflexion 5/5 5/5   (Blank rows = not tested)    FUNCTIONAL TESTS:    GAIT: Distance walked: 30 feet from clinic lobby to exam room Assistive device utilized: Single point cane Level of assistance: Modified independence Comments: antalgic gait pattern   TODAY'S TREATMENT                                                                          DATE: 05/15/22:  TherEx:  Nustep: Level 6 x 8 minutes Leg Press: 100# 2 x 15 bil LE's Leg Press: 50#  2 x 15 Left LE only LAQ: x 10  Sit to stand: x 3 TRX squats 3 x 5 Neuromuscular Re-edu:  Stepping over 6 inch hurdles (3 hurdles) x 10 both directions single UE support Side stepping over 6 inch hurdles (3 hurdles) x 10 each direction single UE support SLS: level surfaces intermittent finger tap support as needed    05/11/22 TherEx:  Nustep: Level 6 x 8 minutes Leg Press: 93# 2 x 15 bil LE's Leg Press: 50#  2 x 15 Left LE only Forward step ups onto 6" step 2x10; leading with LLE Lateral step ups onto 6" step 2x10; leading with LLE Side stepping on foam in // bars x 5 laps; intermittent UE support Tandem gait fwd/bwd in // bars x 5 laps; UE support needed Side stepping up/down ramp x 3 laps bil  05/09/22:  TherEx:  Nustep: Level 5 x 8 minutes Leg Press: 93# 2 x 15 bil LE's Leg Press: 43#  2 x 10 Left LE only Forward step ups onto 6" step 2 x 10 Rocking left knee in lunge position on 6 inch step back and forth Rocker board: df/pf, and side/side each 60 sec Seated LAQ 3x10; 3#  Manual: Seated knee flexion PROM, supine extension with overpressure on Left LE Modalities Vaso x 10 min; Lt knee mod pressure; 34 deg     05/03/22 TherEx:  Nustep: Level 5 x 8 minutes Leg Press:  81# 3 x 10 bil LE's Leg Press: 37# 3 x 10 Left LE only Forward step ups onto 6" step 2x10 Forward step up and over (Lt foot on step) 2x10 reps  Seated LAQ 3x10; 3#   Modalities Vaso x 10 min; Lt knee mod pressure; 34 deg   04/26/22 TherEx NuStep L5 x 8 min Leg Press 75# bil 2x10; then LLE only 25# 2x10 Forward step ups onto 6" step 2x10 with LLE leading; light UE support ROM measurements - see above  Modalities: Vasopneumatic: 34 degrees, medium compression x 10 minutes  (Pt with increased fatigue and lightheadedness so session limited today; BP 119/60)  04/19/22 TherEx:  Recumbent bike seat 4 x 8 min; partial revolutions Seated AA knee flexion 10 x 5-10 sec hold  Demonstrated ways to work on flexion at home including supine heel slides, seated knee flexion with anterior scoot and supine 90 deg hip flexion and passive knee flexion Seated LAQ on Lt 3#, 2x10; 3 sec hold Standing hip abduction with L3 band x 10 reps bil Standing hip extension with L3 band x 10 reps bil  Manual: PROM: left knee flexion and extension with overpressure Patella mobs in supine, instructed in home in long sitting  Modalities: Vasopneumatic: 34 degrees, medium compression x 10 minutes      PATIENT EDUCATION:  Education details: HEP, POC Person educated: Patient Education method: Consulting civil engineer, Demonstration, Verbal cues, and Handouts Education comprehension: verbalized understanding, returned demonstration, and verbal cues required  HOME EXERCISE PROGRAM: Access Code: [email or web address] URL: [email or web address] Date: 05/09/2022 Prepared by: Kearney Hard  Exercises - Supine Quadricep Sets  - 3-4 x daily - 7 x weekly - 2 sets - 10 reps - 5 seconds hold - Supine Heel Slide with Strap  - 3-4 x daily - 7 x weekly - 10 reps - 5-10 seconds hold - Supine Active Straight Leg Raise  - 3-4 x daily - 7 x weekly - 2 sets - 10 reps - Seated Knee Flexion AAROM  - 3-4 x daily - 7 x weekly - 10  reps - 5-10 seconds hold - Sit to Stand with Counter Support  - 3-4 x daily - 7 x weekly - 10 reps - Seated Long Arc Quad  - 2 x daily - 7 x weekly - 2 sets - 10 reps - 5 seconds hold - Forward Step Up  - 2 x daily - 7 x weekly - 2 sets - 10 reps  ASSESSMENT:  CLINICAL IMPRESSION: Pt tolerating strengthening exercises well with neuromuscular re-education for improved dynamic balance and functional mobility.  Will continue to benefit from PT to maximize function.  OBJECTIVE IMPAIRMENTS: decreased activity tolerance, decreased balance, decreased mobility, difficulty walking, decreased ROM, decreased strength, increased edema, and pain.   ACTIVITY LIMITATIONS: bending, sitting, standing, squatting, sleeping, stairs, and transfers  PARTICIPATION LIMITATIONS: cleaning, laundry, and community activity  PERSONAL FACTORS: 3+ comorbidities: see above  are also affecting patient's functional outcome.   REHAB POTENTIAL: Good  CLINICAL DECISION MAKING: Stable/uncomplicated  EVALUATION COMPLEXITY: Low   GOALS: Goals reviewed with patient? Yes  SHORT TERM GOALS: (target date for Short term goals are 3 weeks 04/27/22)   1.  Patient will demonstrate independent use of home exercise program to maintain progress from in clinic treatments.  Goal status: MET 04/17/22  LONG TERM GOALS: (target dates for all long term goals are 12 weeks  06/29/22)   1. Patient will demonstrate/report pain at worst less than or equal to 2/10 to facilitate minimal limitation in daily activity secondary to pain symptoms.  Goal status: On-going 05/15/22   2. Patient will demonstrate independent use of home exercise program to facilitate ability to maintain/progress functional gains from skilled  physical therapy services.  Goal status: On-going 05/15/22   3. Patient will demonstrate FOTO outcome > or = 57 % to indicate reduced disability due to condition.  Goal status: New   4.  Patient will demonstrate Left LE MMT  5/5 throughout to improve pt's functional mobility.   Goal status: On-going 05/01/22   5.  Patient will demonstrate ability to navigate 1 flight of stairs with single hand rail with step over step gait pattern.  Goal status: On-going 05/01/22   6.  Pt will be able to perform sit to stand with no UE support from standard height chair.  Goal status: On-going 05/01/22  7. Pt will improve her left knee active arc of motion from 2 to 115 degrees in order to improve functional mobility.   Goal Status: On-going 05/09/22       PLAN:  PT FREQUENCY: 2-3x/week  PT DURATION: 12 weeks  PLANNED INTERVENTIONS: Therapeutic exercises, Therapeutic activity, Neuro Muscular re-education, Balance training, Gait training, Patient/Family education, Joint mobilization, Stair training, DME instructions, Dry Needling, Electrical stimulation, Traction, Cryotherapy, vasopneumatic deviceMoist heat, Taping, Ultrasound, Ionotophoresis 4mg /ml Dexamethasone, and Manual therapy.  All included unless contraindicated  PLAN FOR NEXT SESSION: prefers NuStep or SciFit bike,  Manual, standing strengthening and balance exercises, Vaso if needed   Kearney Hard, PT, MPT 05/15/22 11:18 AM   05/15/22 11:18 AM

## 2022-05-17 ENCOUNTER — Ambulatory Visit (INDEPENDENT_AMBULATORY_CARE_PROVIDER_SITE_OTHER): Payer: PPO | Admitting: Physical Therapy

## 2022-05-17 ENCOUNTER — Encounter: Payer: Self-pay | Admitting: Physical Therapy

## 2022-05-17 DIAGNOSIS — R600 Localized edema: Secondary | ICD-10-CM | POA: Diagnosis not present

## 2022-05-17 DIAGNOSIS — M25662 Stiffness of left knee, not elsewhere classified: Secondary | ICD-10-CM | POA: Diagnosis not present

## 2022-05-17 DIAGNOSIS — M6281 Muscle weakness (generalized): Secondary | ICD-10-CM | POA: Diagnosis not present

## 2022-05-17 DIAGNOSIS — M25562 Pain in left knee: Secondary | ICD-10-CM

## 2022-05-17 NOTE — Therapy (Signed)
OUTPATIENT PHYSICAL THERAPY LOWER EXTREMITY TREATMENT NOTE   Patient Name: Monick Diffey MRN: [id number] DOB:1950/05/10, 72 y.o., female Today's Date: 05/17/2022    END OF SESSION:  PT End of Session - 05/17/22 0935     Visit Number 13    Number of Visits 28    Date for PT Re-Evaluation 06/29/22    Progress Note Due on Visit 20    PT Start Time 0930    PT Stop Time 1009    PT Time Calculation (min) 39 min    Activity Tolerance Patient tolerated treatment well    Behavior During Therapy WFL for tasks assessed/performed                       Past Medical History:  Diagnosis Date   Allergic rhinitis    Chronic headaches    Complication of anesthesia    Depression    on antidepressent medication   Fatty liver    History of acute bronchitis    Hyperlipidemia    Hypertension    Neuropathy    in toes   PONV (postoperative nausea and vomiting)    mild nausea after gallbladder surgery   Pre-diabetes    Sleep apnea    Past Surgical History:  Procedure Laterality Date   BUNIONECTOMY Right    CATARACT EXTRACTION W/ INTRAOCULAR LENS IMPLANT Bilateral    CHOLECYSTECTOMY  2006   EYE SURGERY     macular surgery   LAPAROSCOPIC HYSTERECTOMY  2005   TOTAL KNEE ARTHROPLASTY Left 03/19/2022   Procedure: LEFT TOTAL KNEE ARTHROPLASTY;  Surgeon: Leandrew Koyanagi, MD;  Location: [facility];  Service: Orthopedics;  Laterality: Left;   Patient Active Problem List   Diagnosis Date Noted   Primary osteoarthritis of left knee 03/19/2022   Status post total left knee replacement 03/19/2022   Headache 01/28/2019   Obstructive sleep apnea 08/01/2018   Insomnia 08/01/2018   GERD (gastroesophageal reflux disease) 08/01/2018    PCP: Fanny Bien, MD   REFERRING PROVIDER: Leandrew Koyanagi, MD  REFERRING DIAG: [phone] (ICD-10-CM) - Primary osteoarthritis of left knee [phone] (ICD-10-CM) - Status post total left knee replacement  THERAPY DIAG:  Acute pain of left knee  Muscle  weakness (generalized)  Localized edema  Stiffness of left knee, not elsewhere classified  Rationale for Evaluation and Treatment: Rehabilitation  ONSET DATE: left TKA on 03/19/22  SUBJECTIVE:   SUBJECTIVE STATEMENT: Knee is doing well; both knees "hurt about the same amount"  PERTINENT HISTORY: Left TKA on 03/19/22, fatty liver, HTN, neuropathy, depression, pre-diabetes  PAIN:  NPRS scale: 2-3/10 Pain location: left knee Pain description: achy, tightness Aggravating factors: sitting or standing Relieving factors: ice, pain meds  PRECAUTIONS: None  WEIGHT BEARING RESTRICTIONS: No  FALLS:  Has patient fallen in last 6 months? No  LIVING ENVIRONMENT: Lives with: lives with their family and lives with their spouse Lives in: House/apartment Stairs: Yes: External: 1 steps; none Has following equipment at home: None  OCCUPATION: retired  PLOF: Independent  PATIENT GOALS: walk better, comfortably, without device   OBJECTIVE:   DIAGNOSTIC FINDINGS:  Status post left total knee arthroplasty. No periprosthetic lucency or fracture. Near anatomic alignment of the prosthetic components. Air within the soft tissues is not unexpected postoperatively.  PATIENT SURVEYS:  04/03/22: FOTO intake:  31%   05/11/22: FOTO 47   SENSATION: 04/03/22 WFL  EDEMA:  Circumferential: Rt:  37.5 centimeters        Left: 47  centimeters  POSTURE: rounded shoulders, forward head, and increased lumbar lordosis  PALPATION: 04/03/22: tender around joint line  LOWER EXTREMITY ROM:   ROM Right 04/03/22 Left 04/03/22 Left 04/10/22 Left 04/12/22 Left 04/17/22 Left 04/19/22 Left 04/26/22 Left 05/01/22 supine Left 05/09/22  Left 05/15/22 Left 05/17/22  Knee flexion A: 130 A: 85 P: 90 A: 88 P: 94 AA: 101 A: 102 P: 105 A: 106 A: 97 P: 104 A: 100 P: 105 sitting A: 100 P: 102 A: 105 A: 110  Knee extension A: 0 A: -10 P: -8 A: -8 P: -6  A: -8 P: -6  A: 0 (LAQ) A: 0 supine A:  A: 0     (Blank rows = not tested)  LOWER EXTREMITY MMT:  MMT Right 04/03/22 Left 04/03/22  Hip flexion 5/5 5/5  Hip extension    Hip abduction    Hip adduction    Hip internal rotation    Hip external rotation    Knee flexion 5/5 3/5  Knee extension 5/5 3/5  Ankle dorsiflexion 5/5 5/5   (Blank rows = not tested)    FUNCTIONAL TESTS:    GAIT: Distance walked: 30 feet from clinic lobby to exam room Assistive device utilized: Single point cane Level of assistance: Modified independence Comments: antalgic gait pattern   TODAY'S TREATMENT                                                                          DATE: 05/17/22 TherEx:  Nustep: Level 6 x 8 minutes Leg Press: 1112# 2 x 15 bil LE's Leg Press: 50#  2 x 15 Left LE only Knee extension machine LLE only 5# 3x10 Knee flexion machine LLE only 15# 3x10 TRX squats 4 x 5 Supine AA heel slides x 20 reps ROM measurements - see above   05/15/22:  TherEx:  Nustep: Level 6 x 8 minutes Leg Press: 100# 2 x 15 bil LE's Leg Press: 50#  2 x 15 Left LE only LAQ: x 10  Sit to stand: x 3 TRX squats 3 x 5 Neuromuscular Re-edu:  Stepping over 6 inch hurdles (3 hurdles) x 10 both directions single UE support Side stepping over 6 inch hurdles (3 hurdles) x 10 each direction single UE support SLS: level surfaces intermittent finger tap support as needed    05/11/22 TherEx:  Nustep: Level 6 x 8 minutes Leg Press: 93# 2 x 15 bil LE's Leg Press: 50#  2 x 15 Left LE only Forward step ups onto 6" step 2x10; leading with LLE Lateral step ups onto 6" step 2x10; leading with LLE Side stepping on foam in // bars x 5 laps; intermittent UE support Tandem gait fwd/bwd in // bars x 5 laps; UE support needed Side stepping up/down ramp x 3 laps bil  05/09/22:  TherEx:  Nustep: Level 5 x 8 minutes Leg Press: 93# 2 x 15 bil LE's Leg Press: 43#  2 x 10 Left LE only Forward step ups onto 6" step 2 x 10 Rocking left knee in lunge position on 6  inch step back and forth Rocker board: df/pf, and side/side each 60 sec Seated LAQ 3x10; 3#  Manual: Seated knee flexion PROM, supine extension with  overpressure on Left LE Modalities Vaso x 10 min; Lt knee mod pressure; 34 deg     PATIENT EDUCATION:  Education details: HEP, POC Person educated: Patient Education method: Explanation, Demonstration, Verbal cues, and Handouts Education comprehension: verbalized understanding, returned demonstration, and verbal cues required  HOME EXERCISE PROGRAM: Access Code: [email or web address] URL: [email or web address] Date: 05/09/2022 Prepared by: Kearney Hard  Exercises - Supine Quadricep Sets  - 3-4 x daily - 7 x weekly - 2 sets - 10 reps - 5 seconds hold - Supine Heel Slide with Strap  - 3-4 x daily - 7 x weekly - 10 reps - 5-10 seconds hold - Supine Active Straight Leg Raise  - 3-4 x daily - 7 x weekly - 2 sets - 10 reps - Seated Knee Flexion AAROM  - 3-4 x daily - 7 x weekly - 10 reps - 5-10 seconds hold - Sit to Stand with Counter Support  - 3-4 x daily - 7 x weekly - 10 reps - Seated Long Arc Quad  - 2 x daily - 7 x weekly - 2 sets - 10 reps - 5 seconds hold - Forward Step Up  - 2 x daily - 7 x weekly - 2 sets - 10 reps  ASSESSMENT:  CLINICAL IMPRESSION: Improved AROM noted today in Lt knee.  Continues to progress well and anticipate we are nearing d/c.  Will continue to benefit from PT to maximize function.  OBJECTIVE IMPAIRMENTS: decreased activity tolerance, decreased balance, decreased mobility, difficulty walking, decreased ROM, decreased strength, increased edema, and pain.   ACTIVITY LIMITATIONS: bending, sitting, standing, squatting, sleeping, stairs, and transfers  PARTICIPATION LIMITATIONS: cleaning, laundry, and community activity  PERSONAL FACTORS: 3+ comorbidities: see above  are also affecting patient's functional outcome.   REHAB POTENTIAL: Good  CLINICAL DECISION MAKING: Stable/uncomplicated  EVALUATION  COMPLEXITY: Low   GOALS: Goals reviewed with patient? Yes  SHORT TERM GOALS: (target date for Short term goals are 3 weeks 04/27/22)   1.  Patient will demonstrate independent use of home exercise program to maintain progress from in clinic treatments.  Goal status: MET 04/17/22  LONG TERM GOALS: (target dates for all long term goals are 12 weeks  06/29/22)   1. Patient will demonstrate/report pain at worst less than or equal to 2/10 to facilitate minimal limitation in daily activity secondary to pain symptoms.  Goal status: On-going 05/15/22   2. Patient will demonstrate independent use of home exercise program to facilitate ability to maintain/progress functional gains from skilled physical therapy services.  Goal status: On-going 05/15/22   3. Patient will demonstrate FOTO outcome > or = 57 % to indicate reduced disability due to condition.  Goal status: New   4.  Patient will demonstrate Left LE MMT 5/5 throughout to improve pt's functional mobility.   Goal status: On-going 05/01/22   5.  Patient will demonstrate ability to navigate 1 flight of stairs with single hand rail with step over step gait pattern.  Goal status: On-going 05/01/22   6.  Pt will be able to perform sit to stand with no UE support from standard height chair.  Goal status: On-going 05/01/22  7. Pt will improve her left knee active arc of motion from 2 to 115 degrees in order to improve functional mobility.   Goal Status: On-going 05/09/22       PLAN:  PT FREQUENCY: 2-3x/week  PT DURATION: 12 weeks  PLANNED INTERVENTIONS: Therapeutic exercises, Therapeutic activity, Neuro Muscular re-education, Balance training, Gait training, Patient/Family  education, Joint mobilization, Stair training, DME instructions, Dry Needling, Electrical stimulation, Traction, Cryotherapy, vasopneumatic deviceMoist heat, Taping, Ultrasound, Ionotophoresis 4mg /ml Dexamethasone, and Manual therapy.  All included unless  contraindicated  PLAN FOR NEXT SESSION: begin d/c planning, update HEP PRN,  prefers NuStep or SciFit bike,  Manual, standing strengthening and balance exercises, Vaso if needed  NEXT MD VISIT: 06/13/22   Laureen Abrahams, PT, DPT 05/17/22 10:11 AM

## 2022-05-21 ENCOUNTER — Ambulatory Visit: Payer: PPO | Admitting: Physical Therapy

## 2022-05-21 ENCOUNTER — Encounter: Payer: Self-pay | Admitting: Physical Therapy

## 2022-05-21 DIAGNOSIS — M6281 Muscle weakness (generalized): Secondary | ICD-10-CM | POA: Diagnosis not present

## 2022-05-21 DIAGNOSIS — R600 Localized edema: Secondary | ICD-10-CM

## 2022-05-21 DIAGNOSIS — M25562 Pain in left knee: Secondary | ICD-10-CM | POA: Diagnosis not present

## 2022-05-21 DIAGNOSIS — M25662 Stiffness of left knee, not elsewhere classified: Secondary | ICD-10-CM | POA: Diagnosis not present

## 2022-05-21 NOTE — Therapy (Signed)
OUTPATIENT PHYSICAL THERAPY LOWER EXTREMITY TREATMENT NOTE   Patient Name: Kimberly Murray MRN: [id number] DOB:Feb 02, 1951, 72 y.o., female Today's Date: 05/17/2022    END OF SESSION:  PT End of Session - 05/17/22 0935     Visit Number 13    Number of Visits 28    Date for PT Re-Evaluation 06/29/22    Progress Note Due on Visit 20    PT Start Time 0930    PT Stop Time 1009    PT Time Calculation (min) 39 min    Activity Tolerance Patient tolerated treatment well    Behavior During Therapy WFL for tasks assessed/performed                       Past Medical History:  Diagnosis Date   Allergic rhinitis    Chronic headaches    Complication of anesthesia    Depression    on antidepressent medication   Fatty liver    History of acute bronchitis    Hyperlipidemia    Hypertension    Neuropathy    in toes   PONV (postoperative nausea and vomiting)    mild nausea after gallbladder surgery   Pre-diabetes    Sleep apnea    Past Surgical History:  Procedure Laterality Date   BUNIONECTOMY Right    CATARACT EXTRACTION W/ INTRAOCULAR LENS IMPLANT Bilateral    CHOLECYSTECTOMY  2006   EYE SURGERY     macular surgery   LAPAROSCOPIC HYSTERECTOMY  2005   TOTAL KNEE ARTHROPLASTY Left 03/19/2022   Procedure: LEFT TOTAL KNEE ARTHROPLASTY;  Surgeon: Leandrew Koyanagi, MD;  Location: [facility];  Service: Orthopedics;  Laterality: Left;   Patient Active Problem List   Diagnosis Date Noted   Primary osteoarthritis of left knee 03/19/2022   Status post total left knee replacement 03/19/2022   Headache 01/28/2019   Obstructive sleep apnea 08/01/2018   Insomnia 08/01/2018   GERD (gastroesophageal reflux disease) 08/01/2018    PCP: Fanny Bien, MD   REFERRING PROVIDER: Leandrew Koyanagi, MD  REFERRING DIAG: [phone] (ICD-10-CM) - Primary osteoarthritis of left knee [phone] (ICD-10-CM) - Status post total left knee replacement  THERAPY DIAG:  Acute pain of left knee  Muscle  weakness (generalized)  Localized edema  Stiffness of left knee, not elsewhere classified  Rationale for Evaluation and Treatment: Rehabilitation  ONSET DATE: left TKA on 03/19/22  SUBJECTIVE:   SUBJECTIVE STATEMENT: Pt arriving to therapy 1/10 pain. Pt reporting occasional sharp random pain even when sitting.   PERTINENT HISTORY: Left TKA on 03/19/22, fatty liver, HTN, neuropathy, depression, pre-diabetes  PAIN:  NPRS scale: 1/10  Pain location: left knee Pain description: achy, tightness Aggravating factors: sitting or standing Relieving factors: ice, pain meds  PRECAUTIONS: None  WEIGHT BEARING RESTRICTIONS: No  FALLS:  Has patient fallen in last 6 months? No  LIVING ENVIRONMENT: Lives with: lives with their family and lives with their spouse Lives in: House/apartment Stairs: Yes: External: 1 steps; none Has following equipment at home: None  OCCUPATION: retired  PLOF: Independent  PATIENT GOALS: walk better, comfortably, without device   OBJECTIVE:   DIAGNOSTIC FINDINGS:  Status post left total knee arthroplasty. No periprosthetic lucency or fracture. Near anatomic alignment of the prosthetic components. Air within the soft tissues is not unexpected postoperatively.  PATIENT SURVEYS:  04/03/22: FOTO intake:  31%   05/11/22: FOTO 47   SENSATION: 04/03/22 WFL  EDEMA:  Circumferential: Rt:  37.5 centimeters  Left: 47 centimeters  POSTURE: rounded shoulders, forward head, and increased lumbar lordosis  PALPATION: 04/03/22: tender around joint line  LOWER EXTREMITY ROM:   ROM Right 04/03/22 Left 04/03/22 Left 04/10/22 Left 04/12/22 Left 04/17/22 Left 04/19/22 Left 04/26/22 Left 05/01/22 supine Left 05/09/22  Left 05/15/22 Left 05/17/22 Left 05/21/22  Knee flexion A: 130 A: 85 P: 90 A: 88 P: 94 AA: 101 A: 102 P: 105 A: 106 A: 97 P: 104 A: 100 P: 105 sitting A: 100 P: 102 A: 105 A: 110 A: 112  Knee extension A: 0 A: -10 P: -8 A: -8 P: -6   A: -8 P: -6  A: 0 (LAQ) A: 0 supine A:  A: 0  A: 0   (Blank rows = not tested)  LOWER EXTREMITY MMT:  MMT Right 04/03/22 Left 04/03/22 Left 05/21/22  Hip flexion 5/5 5/5 5/5  Hip extension     Hip abduction     Hip adduction     Hip internal rotation     Hip external rotation     Knee flexion 5/5 3/5 4+/5  Knee extension 5/5 3/5 4+/5  Ankle dorsiflexion 5/5 5/5 4+/5   (Blank rows = not tested)    FUNCTIONAL TESTS:    GAIT: Distance walked: 30 feet from clinic lobby to exam room Assistive device utilized: Single point cane Level of assistance: Modified independence Comments: antalgic gait pattern   TODAY'S TREATMENT                                                                          DATE: 05/21/22 TherEx:  Nustep: Level 6 x 8 minutes Leg Press: 118# 2 x 10 bil LE's Leg Press: 56#  x 10 Left LE only Knee extension machine LLE 10#, bil LE's lifting and Left LE lowering Knee flexion machine LLE only 15# 2x10 Supine Heel slides for ROM measurements x 15  Hip abduction c Level 3 band 2 x 10 each LE There Activities:  Up and down 1 flight of stairs in clinic with single hand rail     05/17/22 TherEx:  Nustep: Level 6 x 8 minutes Leg Press: 1112# 2 x 15 bil LE's Leg Press: 50#  2 x 15 Left LE only Knee extension machine LLE only 5# 3x10 Knee flexion machine LLE only 15# 3x10 TRX squats 4 x 5 Supine AA heel slides x 20 reps ROM measurements - see above   05/15/22:  TherEx:  Nustep: Level 6 x 8 minutes Leg Press: 100# 2 x 15 bil LE's Leg Press: 50#  2 x 15 Left LE only LAQ: x 10  Sit to stand: x 3 TRX squats 3 x 5 Neuromuscular Re-edu:  Stepping over 6 inch hurdles (3 hurdles) x 10 both directions single UE support Side stepping over 6 inch hurdles (3 hurdles) x 10 each direction single UE support SLS: level surfaces intermittent finger tap support as needed         PATIENT EDUCATION:  Education details: HEP, POC Person educated:  Patient Education method: Consulting civil engineer, Demonstration, Verbal cues, and Handouts Education comprehension: verbalized understanding, returned demonstration, and verbal cues required  HOME EXERCISE PROGRAM: Access Code: [email or web address] URL: [email or web address] Date: 05/09/2022 Prepared by: Kearney Hard  Exercises - Supine Quadricep Sets  - 3-4 x daily - 7 x weekly - 2 sets - 10 reps - 5 seconds hold - Supine Heel Slide with Strap  - 3-4 x daily - 7 x weekly - 10 reps - 5-10 seconds hold - Supine Active Straight Leg Raise  - 3-4 x daily - 7 x weekly - 2 sets - 10 reps - Seated Knee Flexion AAROM  - 3-4 x daily - 7 x weekly - 10 reps - 5-10 seconds hold - Sit to Stand with Counter Support  - 3-4 x daily - 7 x weekly - 10 reps - Seated Long Arc Quad  - 2 x daily - 7 x weekly - 2 sets - 10 reps - 5 seconds hold - Forward Step Up  - 2 x daily - 7 x weekly - 2 sets - 10 reps  ASSESSMENT:  CLINICAL IMPRESSION: Continue skilled PT to maximize pt's function. Pt is making progress with active left knee flexion of 112 degrees. Pt is progressing toward discharge to gym based program soon. Will continue to benefit from PT to maximize function.  OBJECTIVE IMPAIRMENTS: decreased activity tolerance, decreased balance, decreased mobility, difficulty walking, decreased ROM, decreased strength, increased edema, and pain.   ACTIVITY LIMITATIONS: bending, sitting, standing, squatting, sleeping, stairs, and transfers  PARTICIPATION LIMITATIONS: cleaning, laundry, and community activity  PERSONAL FACTORS: 3+ comorbidities: see above  are also affecting patient's functional outcome.   REHAB POTENTIAL: Good  CLINICAL DECISION MAKING: Stable/uncomplicated  EVALUATION COMPLEXITY: Low   GOALS: Goals reviewed with patient? Yes  SHORT TERM GOALS: (target date for Short term goals are 3 weeks 04/27/22)   1.  Patient will demonstrate independent use of home exercise program to maintain progress  from in clinic treatments.  Goal status: MET 04/17/22  LONG TERM GOALS: (target dates for all long term goals are 12 weeks  06/29/22)   1. Patient will demonstrate/report pain at worst less than or equal to 2/10 to facilitate minimal limitation in daily activity secondary to pain symptoms.  Goal status: On-going 05/21/22   2. Patient will demonstrate independent use of home exercise program to facilitate ability to maintain/progress functional gains from skilled physical therapy services.  Goal status: On-going 05/15/22   3. Patient will demonstrate FOTO outcome > or = 57 % to indicate reduced disability due to condition.  Goal status: New   4.  Patient will demonstrate Left LE MMT 5/5 throughout to improve pt's functional mobility.   Goal status: On-going 05/01/22   5.  Patient will demonstrate ability to navigate 1 flight of stairs with single hand rail with step over step gait pattern.  Goal status: On-going 05/21/22   6.  Pt will be able to perform sit to stand with no UE support from standard height chair.  Goal status: MET 05/21/22  7. Pt will improve her left knee active arc of motion from 2 to 115 degrees in order to improve functional mobility.   Goal Status: On-going 05/21/22       PLAN:  PT FREQUENCY: 2-3x/week  PT DURATION: 12 weeks  PLANNED INTERVENTIONS: Therapeutic exercises, Therapeutic activity, Neuro Muscular re-education, Balance training, Gait training, Patient/Family education, Joint mobilization, Stair training, DME instructions, Dry Needling, Electrical stimulation, Traction, Cryotherapy, vasopneumatic deviceMoist heat, Taping, Ultrasound, Ionotophoresis 4mg /ml Dexamethasone, and Manual therapy.  All included unless contraindicated  PLAN FOR NEXT SESSION: begin d/c planning after pt gets back from her vacation and consider dropping to 1x/ week for discharge,  prefers NuStep or SciFit bike,  Manual, standing strengthening and balance exercises, Vaso if  needed  NEXT MD VISIT: 06/13/22   Kearney Hard, PT, MPT 05/21/22 11:02 AM   05/17/22 10:11 AM

## 2022-05-23 ENCOUNTER — Ambulatory Visit: Payer: PPO | Admitting: Physical Therapy

## 2022-05-23 ENCOUNTER — Encounter: Payer: Self-pay | Admitting: Physical Therapy

## 2022-05-23 DIAGNOSIS — M25562 Pain in left knee: Secondary | ICD-10-CM | POA: Diagnosis not present

## 2022-05-23 DIAGNOSIS — M6281 Muscle weakness (generalized): Secondary | ICD-10-CM | POA: Diagnosis not present

## 2022-05-23 DIAGNOSIS — M25662 Stiffness of left knee, not elsewhere classified: Secondary | ICD-10-CM | POA: Diagnosis not present

## 2022-05-23 DIAGNOSIS — R600 Localized edema: Secondary | ICD-10-CM

## 2022-05-23 NOTE — Therapy (Signed)
OUTPATIENT PHYSICAL THERAPY LOWER EXTREMITY TREATMENT NOTE   Patient Name: Kimberly Murray MRN: [id number] DOB:08-27-1950, 72 y.o., female Today's Date: 05/23/2022    END OF SESSION:  PT End of Session - 05/23/22 1129     Visit Number 15    Number of Visits 28    Date for PT Re-Evaluation 06/29/22    Progress Note Due on Visit 20    PT Start Time 1102    PT Stop Time 1142    PT Time Calculation (min) 40 min    Activity Tolerance Patient tolerated treatment well    Behavior During Therapy WFL for tasks assessed/performed                        Past Medical History:  Diagnosis Date   Allergic rhinitis    Chronic headaches    Complication of anesthesia    Depression    on antidepressent medication   Fatty liver    History of acute bronchitis    Hyperlipidemia    Hypertension    Neuropathy    in toes   PONV (postoperative nausea and vomiting)    mild nausea after gallbladder surgery   Pre-diabetes    Sleep apnea    Past Surgical History:  Procedure Laterality Date   BUNIONECTOMY Right    CATARACT EXTRACTION W/ INTRAOCULAR LENS IMPLANT Bilateral    CHOLECYSTECTOMY  2006   EYE SURGERY     macular surgery   LAPAROSCOPIC HYSTERECTOMY  2005   TOTAL KNEE ARTHROPLASTY Left 03/19/2022   Procedure: LEFT TOTAL KNEE ARTHROPLASTY;  Surgeon: Leandrew Koyanagi, MD;  Location: [facility];  Service: Orthopedics;  Laterality: Left;   Patient Active Problem List   Diagnosis Date Noted   Primary osteoarthritis of left knee 03/19/2022   Status post total left knee replacement 03/19/2022   Headache 01/28/2019   Obstructive sleep apnea 08/01/2018   Insomnia 08/01/2018   GERD (gastroesophageal reflux disease) 08/01/2018    PCP: Fanny Bien, MD   REFERRING PROVIDER: Leandrew Koyanagi, MD  REFERRING DIAG: [phone] (ICD-10-CM) - Primary osteoarthritis of left knee [phone] (ICD-10-CM) - Status post total left knee replacement  THERAPY DIAG:  Acute pain of left  knee  Localized edema  Muscle weakness (generalized)  Stiffness of left knee, not elsewhere classified  Rationale for Evaluation and Treatment: Rehabilitation  ONSET DATE: left TKA on 03/19/22  SUBJECTIVE:   SUBJECTIVE STATEMENT:  Feeling good this morning. Stairs are still hard, mostly hard because of my other knee. I'm a little cautious with my balance, yard is sloped and it can be a challenge for me.   PERTINENT HISTORY: Left TKA on 03/19/22, fatty liver, HTN, neuropathy, depression, pre-diabetes  PAIN:  NPRS scale: 0/10  Pain location:  Pain description:  Aggravating factors:  Relieving factors:  PRECAUTIONS: None  WEIGHT BEARING RESTRICTIONS: No  FALLS:  Has patient fallen in last 6 months? No  LIVING ENVIRONMENT: Lives with: lives with their family and lives with their spouse Lives in: House/apartment Stairs: Yes: External: 1 steps; none Has following equipment at home: None  OCCUPATION: retired  PLOF: Independent  PATIENT GOALS: walk better, comfortably, without device   OBJECTIVE:   DIAGNOSTIC FINDINGS:  Status post left total knee arthroplasty. No periprosthetic lucency or fracture. Near anatomic alignment of the prosthetic components. Air within the soft tissues is not unexpected postoperatively.  PATIENT SURVEYS:  04/03/22: FOTO intake:  31%   05/11/22: FOTO 47   SENSATION:  04/03/22 WFL  EDEMA:  Circumferential: Rt:  37.5 centimeters        Left: 47 centimeters  POSTURE: rounded shoulders, forward head, and increased lumbar lordosis  PALPATION: 04/03/22: tender around joint line  LOWER EXTREMITY ROM:   ROM Right 04/03/22 Left 04/03/22 Left 04/10/22 Left 04/12/22 Left 04/17/22 Left 04/19/22 Left 04/26/22 Left 05/01/22 supine Left 05/09/22  Left 05/15/22 Left 05/17/22 Left 05/21/22  Knee flexion A: 130 A: 85 P: 90 A: 88 P: 94 AA: 101 A: 102 P: 105 A: 106 A: 97 P: 104 A: 100 P: 105 sitting A: 100 P: 102 A: 105 A: 110 A: 112  Knee  extension A: 0 A: -10 P: -8 A: -8 P: -6  A: -8 P: -6  A: 0 (LAQ) A: 0 supine A:  A: 0  A: 0   (Blank rows = not tested)  LOWER EXTREMITY MMT:  MMT Right 04/03/22 Left 04/03/22 Left 05/21/22  Hip flexion 5/5 5/5 5/5  Hip extension     Hip abduction     Hip adduction     Hip internal rotation     Hip external rotation     Knee flexion 5/5 3/5 4+/5  Knee extension 5/5 3/5 4+/5  Ankle dorsiflexion 5/5 5/5 4+/5   (Blank rows = not tested)    FUNCTIONAL TESTS:    GAIT: Distance walked: 30 feet from clinic lobby to exam room Assistive device utilized: Single point cane Level of assistance: Modified independence Comments: antalgic gait pattern   TODAY'S TREATMENT                                                                          DATE:   05/23/22  TherEx  Nustep L6 x8 minutes BLEs only  BLE leg press 118# 2x12  Single LE leg press 56# 2x12  Forward step downs 4 inch box U UE support x15 Eccentric sit to stands x8- limited by R knee pain  3 way hip green TB x10 B  NMR  Tandem stance blue foam pad 3x30 seconds B  SLS with one foot on side of TM blue foam pad 3x30 seconds B    05/21/22 TherEx:  Nustep: Level 6 x 8 minutes Leg Press: 118# 2 x 10 bil LE's Leg Press: 56#  x 10 Left LE only Knee extension machine LLE 10#, bil LE's lifting and Left LE lowering Knee flexion machine LLE only 15# 2x10 Supine Heel slides for ROM measurements x 15  Hip abduction c Level 3 band 2 x 10 each LE There Activities:  Up and down 1 flight of stairs in clinic with single hand rail     05/17/22 TherEx:  Nustep: Level 6 x 8 minutes Leg Press: 1112# 2 x 15 bil LE's Leg Press: 50#  2 x 15 Left LE only Knee extension machine LLE only 5# 3x10 Knee flexion machine LLE only 15# 3x10 TRX squats 4 x 5 Supine AA heel slides x 20 reps ROM measurements - see above   05/15/22:  TherEx:  Nustep: Level 6 x 8 minutes Leg Press: 100# 2 x 15 bil LE's Leg Press: 50#  2 x 15 Left  LE only LAQ: x 10  Sit to  stand: x 3 TRX squats 3 x 5 Neuromuscular Re-edu:  Stepping over 6 inch hurdles (3 hurdles) x 10 both directions single UE support Side stepping over 6 inch hurdles (3 hurdles) x 10 each direction single UE support SLS: level surfaces intermittent finger tap support as needed         PATIENT EDUCATION:  Education details: HEP, POC Person educated: Patient Education method: Explanation, Demonstration, Verbal cues, and Handouts Education comprehension: verbalized understanding, returned demonstration, and verbal cues required  HOME EXERCISE PROGRAM: Access Code: [email or web address] URL: [email or web address] Date: 05/09/2022 Prepared by: Kearney Hard  Exercises - Supine Quadricep Sets  - 3-4 x daily - 7 x weekly - 2 sets - 10 reps - 5 seconds hold - Supine Heel Slide with Strap  - 3-4 x daily - 7 x weekly - 10 reps - 5-10 seconds hold - Supine Active Straight Leg Raise  - 3-4 x daily - 7 x weekly - 2 sets - 10 reps - Seated Knee Flexion AAROM  - 3-4 x daily - 7 x weekly - 10 reps - 5-10 seconds hold - Sit to Stand with Counter Support  - 3-4 x daily - 7 x weekly - 10 reps - Seated Long Arc Quad  - 2 x daily - 7 x weekly - 2 sets - 10 reps - 5 seconds hold - Forward Step Up  - 2 x daily - 7 x weekly - 2 sets - 10 reps  ASSESSMENT:  CLINICAL IMPRESSION:  Ms. Bem arrives today doing well, pain is doing great. Does tell me that she has noticed some unsteadiness when trying to work in her yard, we did work on a bit of balance training today, otherwise continued with functional strengthening as tolerated. Will continue efforts.   OBJECTIVE IMPAIRMENTS: decreased activity tolerance, decreased balance, decreased mobility, difficulty walking, decreased ROM, decreased strength, increased edema, and pain.   ACTIVITY LIMITATIONS: bending, sitting, standing, squatting, sleeping, stairs, and transfers  PARTICIPATION LIMITATIONS: cleaning, laundry, and  community activity  PERSONAL FACTORS: 3+ comorbidities: see above  are also affecting patient's functional outcome.   REHAB POTENTIAL: Good  CLINICAL DECISION MAKING: Stable/uncomplicated  EVALUATION COMPLEXITY: Low   GOALS: Goals reviewed with patient? Yes  SHORT TERM GOALS: (target date for Short term goals are 3 weeks 04/27/22)   1.  Patient will demonstrate independent use of home exercise program to maintain progress from in clinic treatments.  Goal status: MET 04/17/22  LONG TERM GOALS: (target dates for all long term goals are 12 weeks  06/29/22)   1. Patient will demonstrate/report pain at worst less than or equal to 2/10 to facilitate minimal limitation in daily activity secondary to pain symptoms.  Goal status: On-going 05/21/22   2. Patient will demonstrate independent use of home exercise program to facilitate ability to maintain/progress functional gains from skilled physical therapy services.  Goal status: On-going 05/15/22   3. Patient will demonstrate FOTO outcome > or = 57 % to indicate reduced disability due to condition.  Goal status: New   4.  Patient will demonstrate Left LE MMT 5/5 throughout to improve pt's functional mobility.   Goal status: On-going 05/01/22   5.  Patient will demonstrate ability to navigate 1 flight of stairs with single hand rail with step over step gait pattern.  Goal status: On-going 05/21/22   6.  Pt will be able to perform sit to stand with no UE support from standard height chair.  Goal status: MET 05/21/22  7.  Pt will improve her left knee active arc of motion from 2 to 115 degrees in order to improve functional mobility.   Goal Status: On-going 05/21/22       PLAN:  PT FREQUENCY: 2-3x/week  PT DURATION: 12 weeks  PLANNED INTERVENTIONS: Therapeutic exercises, Therapeutic activity, Neuro Muscular re-education, Balance training, Gait training, Patient/Family education, Joint mobilization, Stair training, DME instructions, Dry  Needling, Electrical stimulation, Traction, Cryotherapy, vasopneumatic deviceMoist heat, Taping, Ultrasound, Ionotophoresis 4mg /ml Dexamethasone, and Manual therapy.  All included unless contraindicated  PLAN FOR NEXT SESSION: begin d/c planning after pt gets back from her vacation and consider dropping to 1x/ week for discharge,  prefers NuStep or SciFit bike,  Manual, standing strengthening and balance exercises, Vaso if needed. Add in more balance?   NEXT MD VISIT: 06/13/22   Deniece Ree PT DPT PN2    05/23/22 11:42 AM

## 2022-05-28 ENCOUNTER — Ambulatory Visit: Payer: PPO | Admitting: Physical Therapy

## 2022-05-28 ENCOUNTER — Encounter: Payer: Self-pay | Admitting: Physical Therapy

## 2022-05-28 DIAGNOSIS — R600 Localized edema: Secondary | ICD-10-CM | POA: Diagnosis not present

## 2022-05-28 DIAGNOSIS — M25662 Stiffness of left knee, not elsewhere classified: Secondary | ICD-10-CM

## 2022-05-28 DIAGNOSIS — M25562 Pain in left knee: Secondary | ICD-10-CM

## 2022-05-28 DIAGNOSIS — M6281 Muscle weakness (generalized): Secondary | ICD-10-CM | POA: Diagnosis not present

## 2022-05-28 NOTE — Therapy (Signed)
OUTPATIENT PHYSICAL THERAPY LOWER EXTREMITY TREATMENT NOTE   Patient Name: Kimberly Murray MRN: [id number] DOB:1951/02/14, 72 y.o., female Today's Date: 05/28/2022    END OF SESSION:  PT End of Session - 05/28/22 1141     Visit Number 16    Number of Visits 28    Date for PT Re-Evaluation 06/29/22    Progress Note Due on Visit 20    PT Start Time 1055    PT Stop Time 1138    PT Time Calculation (min) 43 min    Activity Tolerance Patient tolerated treatment well    Behavior During Therapy WFL for tasks assessed/performed                        Past Medical History:  Diagnosis Date   Allergic rhinitis    Chronic headaches    Complication of anesthesia    Depression    on antidepressent medication   Fatty liver    History of acute bronchitis    Hyperlipidemia    Hypertension    Neuropathy    in toes   PONV (postoperative nausea and vomiting)    mild nausea after gallbladder surgery   Pre-diabetes    Sleep apnea    Past Surgical History:  Procedure Laterality Date   BUNIONECTOMY Right    CATARACT EXTRACTION W/ INTRAOCULAR LENS IMPLANT Bilateral    CHOLECYSTECTOMY  2006   EYE SURGERY     macular surgery   LAPAROSCOPIC HYSTERECTOMY  2005   TOTAL KNEE ARTHROPLASTY Left 03/19/2022   Procedure: LEFT TOTAL KNEE ARTHROPLASTY;  Surgeon: Kimberly Murray, Kimberly M, MD;  Location: [facility] OR;  Service: Orthopedics;  Laterality: Left;   Patient Active Problem List   Diagnosis Date Noted   Primary osteoarthritis of left knee 03/19/2022   Status post total left knee replacement 03/19/2022   Headache 01/28/2019   Obstructive sleep apnea 08/01/2018   Insomnia 08/01/2018   GERD (gastroesophageal reflux disease) 08/01/2018    PCP: Kimberly Murray, Kimberly R, MD   REFERRING PROVIDER: Tarry Murray, Kimberly M, MD  REFERRING DIAG: [phone] (ICD-10-CM) - Primary osteoarthritis of left knee [phone] (ICD-10-CM) - Status post total left knee replacement  THERAPY DIAG:  Acute pain of left  knee  Localized edema  Muscle weakness (generalized)  Stiffness of left knee, not elsewhere classified  Rationale for Evaluation and Treatment: Rehabilitation  ONSET DATE: left TKA on 03/19/22  SUBJECTIVE:   SUBJECTIVE STATEMENT:  Feeling good this morning. Stairs are still hard, mostly hard because of my other knee. I'Murray a little cautious with my balance, yard is sloped and it can be a challenge for me.   PERTINENT HISTORY: Left TKA on 03/19/22, fatty liver, HTN, neuropathy, depression, pre-diabetes  PAIN:  NPRS scale: 0/10  Pain location:  Pain description:  Aggravating factors:  Relieving factors:  PRECAUTIONS: None  WEIGHT BEARING RESTRICTIONS: No  FALLS:  Has patient fallen in last 6 months? No  LIVING ENVIRONMENT: Lives with: lives with their family and lives with their spouse Lives in: House/apartment Stairs: Yes: External: 1 steps; none Has following equipment at home: None  OCCUPATION: retired  PLOF: Independent  PATIENT GOALS: walk better, comfortably, without device   OBJECTIVE:   DIAGNOSTIC FINDINGS:  Status post left total knee arthroplasty. No periprosthetic lucency or fracture. Near anatomic alignment of the prosthetic components. Air within the soft tissues is not unexpected postoperatively.  PATIENT SURVEYS:  04/03/22: FOTO intake:  31%   05/11/22: FOTO 47   SENSATION:  04/03/22 WFL  EDEMA:  Circumferential: Rt:  37.5 centimeters        Left: 47 centimeters  POSTURE: rounded shoulders, forward head, and increased lumbar lordosis  PALPATION: 04/03/22: tender around joint line  LOWER EXTREMITY ROM:   ROM Right 04/03/22 Left 04/03/22 Left 04/10/22 Left 04/12/22 Left 04/17/22 Left 04/19/22 Left 04/26/22 Left 05/01/22 supine Left 05/09/22  Left 05/15/22 Left 05/17/22 Left 05/21/22  Knee flexion A: 130 A: 85 P: 90 A: 88 P: 94 AA: 101 A: 102 P: 105 A: 106 A: 97 P: 104 A: 100 P: 105 sitting A: 100 P: 102 A: 105 A: 110 A: 112  Knee  extension A: 0 A: -10 P: -8 A: -8 P: -6  A: -8 P: -6  A: 0 (LAQ) A: 0 supine A:  A: 0  A: 0   (Blank rows = not tested)  LOWER EXTREMITY MMT:  MMT Right 04/03/22 Left 04/03/22 Left 05/21/22  Hip flexion 5/5 5/5 5/5  Hip extension     Hip abduction     Hip adduction     Hip internal rotation     Hip external rotation     Knee flexion 5/5 3/5 4+/5  Knee extension 5/5 3/5 4+/5  Ankle dorsiflexion 5/5 5/5 4+/5   (Blank rows = not tested)    FUNCTIONAL TESTS:    GAIT: Distance walked: 30 feet from clinic lobby to exam room Assistive device utilized: Single point cane Level of assistance: Modified independence Comments: antalgic gait pattern   TODAY'S TREATMENT                                                                          DATE: 05/28/22:  TherEx  Nustep L6 x8 minutes UE/LE  only  BLE leg press 118# 2 x 15 Left  LE leg press 62# 2 x 10, Rt LE 43 x 10, 37# x 10 Lateral Step up and over x 15 each direction on 6 inch step c UE support Eccentric sit to stands x8- limited by Murray knee pain  Hip adduction, hip flexion, hip abduction all c green TB x 15  Bil LE's  NMR Tandem walking 10 feet x 2 Stepping over 6 inch hurdles (6 hurdles x 2 reps)  05/23/22  TherEx  Nustep L6 x8 minutes BLEs only  BLE leg press 118# 2x12  Single LE leg press 56# 2x12  Forward step downs 4 inch box U UE support x15 Eccentric sit to stands x8- limited by Murray knee pain  3 way hip green TB x10 B  NMR  Tandem stance blue foam pad 3x30 seconds B  SLS with one foot on side of TM blue foam pad 3x30 seconds B    05/21/22 TherEx:  Nustep: Level 6 x 8 minutes Leg Press: 118# 2 x 10 bil LE's Leg Press: 56#  x 10 Left LE only Knee extension machine LLE 10#, bil LE's lifting and Left LE lowering Knee flexion machine LLE only 15# 2x10 Supine Heel slides for ROM measurements x 15  Hip abduction c Level 3 band 2 x 10 each LE There Activities:  Up and down 1 flight of stairs in clinic with  single hand rail  05/17/22 TherEx:  Nustep: Level 6 x 8 minutes Leg Press: 1112# 2 x 15 bil LE's Leg Press: 50#  2 x 15 Left LE only Knee extension machine LLE only 5# 3x10 Knee flexion machine LLE only 15# 3x10 TRX squats 4 x 5 Supine AA heel slides x 20 reps ROM measurements - see above     PATIENT EDUCATION:  Education details: HEP, POC Person educated: Patient Education method: Programmer, multimedia, Demonstration, Verbal cues, and Handouts Education comprehension: verbalized understanding, returned demonstration, and verbal cues required  HOME EXERCISE PROGRAM: Access Code: [email or web address] URL: [email or web address] Date: 05/09/2022 Prepared by: Narda Amber  Exercises - Supine Quadricep Sets  - 3-4 x daily - 7 x weekly - 2 sets - 10 reps - 5 seconds hold - Supine Heel Slide with Strap  - 3-4 x daily - 7 x weekly - 10 reps - 5-10 seconds hold - Supine Active Straight Leg Raise  - 3-4 x daily - 7 x weekly - 2 sets - 10 reps - Seated Knee Flexion AAROM  - 3-4 x daily - 7 x weekly - 10 reps - 5-10 seconds hold - Sit to Stand with Counter Support  - 3-4 x daily - 7 x weekly - 10 reps - Seated Long Arc Quad  - 2 x daily - 7 x weekly - 2 sets - 10 reps - 5 seconds hold - Forward Step Up  - 2 x daily - 7 x weekly - 2 sets - 10 reps  ASSESSMENT:  CLINICAL IMPRESSION: Pt arriving today reporting LE soreness and fatigue after being on her feet all weekend at a yard sale. Pt tolerating exercises well focusing on LE strengthening. Pt feels at this point her Rt LE is worse than her left. Pt on vacation the rest of the week. Pt instructed to ice and elevate as needed for pain and edema control. Continue skilled PT interventions to maximize pt's function.   OBJECTIVE IMPAIRMENTS: decreased activity tolerance, decreased balance, decreased mobility, difficulty walking, decreased ROM, decreased strength, increased edema, and pain.   ACTIVITY LIMITATIONS: bending, sitting,  standing, squatting, sleeping, stairs, and transfers  PARTICIPATION LIMITATIONS: cleaning, laundry, and community activity  PERSONAL FACTORS: 3+ comorbidities: see above  are also affecting patient's functional outcome.   REHAB POTENTIAL: Good  CLINICAL DECISION MAKING: Stable/uncomplicated  EVALUATION COMPLEXITY: Low   GOALS: Goals reviewed with patient? Yes  SHORT TERM GOALS: (target date for Short term goals are 3 weeks 04/27/22)   1.  Patient will demonstrate independent use of home exercise program to maintain progress from in clinic treatments.  Goal status: MET 04/17/22  LONG TERM GOALS: (target dates for all long term goals are 12 weeks  06/29/22)   1. Patient will demonstrate/report pain at worst less than or equal to 2/10 to facilitate minimal limitation in daily activity secondary to pain symptoms.  Goal status: On-going 05/28/22   2. Patient will demonstrate independent use of home exercise program to facilitate ability to maintain/progress functional gains from skilled physical therapy services.  Goal status: On-going 4/8//24   3. Patient will demonstrate FOTO outcome > or = 57 % to indicate reduced disability due to condition.  Goal status: New   4.  Patient will demonstrate Left LE MMT 5/5 throughout to improve pt's functional mobility.   Goal status: On-going 05/01/22   5.  Patient will demonstrate ability to navigate 1 flight of stairs with single hand rail with step over step gait pattern.  Goal status: On-going 05/21/22  6.  Pt will be able to perform sit to stand with no UE support from standard height chair.  Goal status: MET 05/21/22  7. Pt will improve her left knee active arc of motion from 2 to 115 degrees in order to improve functional mobility.   Goal Status: On-going 05/21/22       PLAN:  PT FREQUENCY: 2-3x/week  PT DURATION: 12 weeks  PLANNED INTERVENTIONS: Therapeutic exercises, Therapeutic activity, Neuro Muscular re-education, Balance  training, Gait training, Patient/Family education, Joint mobilization, Stair training, DME instructions, Dry Needling, Electrical stimulation, Traction, Cryotherapy, vasopneumatic deviceMoist heat, Taping, Ultrasound, Ionotophoresis 4mg /ml Dexamethasone, and Manual therapy.  All included unless contraindicated  PLAN FOR NEXT SESSION: begin d/c planning after pt gets back from her vacation and consider dropping to 1x/ week for discharge,  prefers NuStep or SciFit bike,  Manual, standing strengthening and balance exercises,  Balance  NEXT MD VISIT: 06/13/22  Narda Amber, PT, MPT 05/28/22 11:43 AM     05/28/22 11:43 AM

## 2022-05-30 ENCOUNTER — Encounter: Payer: PPO | Admitting: Physical Therapy

## 2022-06-11 ENCOUNTER — Ambulatory Visit: Payer: PPO | Admitting: Physical Therapy

## 2022-06-11 ENCOUNTER — Encounter: Payer: Self-pay | Admitting: Physical Therapy

## 2022-06-11 DIAGNOSIS — R600 Localized edema: Secondary | ICD-10-CM | POA: Diagnosis not present

## 2022-06-11 DIAGNOSIS — M6281 Muscle weakness (generalized): Secondary | ICD-10-CM | POA: Diagnosis not present

## 2022-06-11 DIAGNOSIS — M25662 Stiffness of left knee, not elsewhere classified: Secondary | ICD-10-CM

## 2022-06-11 DIAGNOSIS — M25562 Pain in left knee: Secondary | ICD-10-CM | POA: Diagnosis not present

## 2022-06-11 NOTE — Therapy (Signed)
OUTPATIENT PHYSICAL THERAPY LOWER EXTREMITY TREATMENT NOTE PROGRESS NOTE   Patient Name: Kimberly Murray MRN: [id number] DOB:Mar 23, 1950, 72 y.o., female Today's Date: 06/11/2022  Progress Note Reporting Period 05/10/22 to 06/11/2022   See note below for Objective Data and Assessment of Progress/Goals.      END OF SESSION:  PT End of Session - 06/11/22 1034     Visit Number 17    Number of Visits 28    Date for PT Re-Evaluation 06/29/22    Progress Note Due on Visit 27    PT Start Time 1020    PT Stop Time 1100    PT Time Calculation (min) 40 min    Activity Tolerance Patient tolerated treatment well    Behavior During Therapy WFL for tasks assessed/performed                         Past Medical History:  Diagnosis Date   Allergic rhinitis    Chronic headaches    Complication of anesthesia    Depression    on antidepressent medication   Fatty liver    History of acute bronchitis    Hyperlipidemia    Hypertension    Neuropathy    in toes   PONV (postoperative nausea and vomiting)    mild nausea after gallbladder surgery   Pre-diabetes    Sleep apnea    Past Surgical History:  Procedure Laterality Date   BUNIONECTOMY Right    CATARACT EXTRACTION W/ INTRAOCULAR LENS IMPLANT Bilateral    CHOLECYSTECTOMY  2006   EYE SURGERY     macular surgery   LAPAROSCOPIC HYSTERECTOMY  2005   TOTAL KNEE ARTHROPLASTY Left 03/19/2022   Procedure: LEFT TOTAL KNEE ARTHROPLASTY;  Surgeon: Tarry Kos, MD;  Location: [facility] OR;  Service: Orthopedics;  Laterality: Left;   Patient Active Problem List   Diagnosis Date Noted   Primary osteoarthritis of left knee 03/19/2022   Status post total left knee replacement 03/19/2022   Headache 01/28/2019   Obstructive sleep apnea 08/01/2018   Insomnia 08/01/2018   GERD (gastroesophageal reflux disease) 08/01/2018    PCP: Lewis Moccasin, MD   REFERRING PROVIDER: Tarry Kos, MD  REFERRING DIAG: [phone] (ICD-10-CM) -  Primary osteoarthritis of left knee [phone] (ICD-10-CM) - Status post total left knee replacement  THERAPY DIAG:  Acute pain of left knee  Localized edema  Muscle weakness (generalized)  Stiffness of left knee, not elsewhere classified  Rationale for Evaluation and Treatment: Rehabilitation  ONSET DATE: left TKA on 03/19/22  SUBJECTIVE:   SUBJECTIVE STATEMENT:  Pt reporting soreness on left lateral knee on her vacation to [place]. Pt also reporting fall this past Friday down steps.   PERTINENT HISTORY: Left TKA on 03/19/22, fatty liver, HTN, neuropathy, depression, pre-diabetes  PAIN:  NPRS scale:2-3/10 Pain location:  Pain description:  Aggravating factors:  Relieving factors:  PRECAUTIONS: None  WEIGHT BEARING RESTRICTIONS: No  FALLS:  Has patient fallen in last 6 months? No  LIVING ENVIRONMENT: Lives with: lives with their family and lives with their spouse Lives in: House/apartment Stairs: Yes: External: 1 steps; none Has following equipment at home: None  OCCUPATION: retired  PLOF: Independent  PATIENT GOALS: walk better, comfortably, without device   OBJECTIVE:   DIAGNOSTIC FINDINGS:  Status post left total knee arthroplasty. No periprosthetic lucency or fracture. Near anatomic alignment of the prosthetic components. Air within the soft tissues is not unexpected postoperatively.  PATIENT SURVEYS:  04/03/22: Janyth Contes  intake:  31%   05/11/22: FOTO 47 % 06/11/22: FOTO 67%   SENSATION: 04/03/22 WFL  EDEMA:  Circumferential: Rt:  37.5 centimeters        Left: 47 centimeters  POSTURE: rounded shoulders, forward head, and increased lumbar lordosis  PALPATION: 04/03/22: tender around joint line  LOWER EXTREMITY ROM:   ROM Right 04/03/22 Left 04/03/22 Left 04/10/22 Left 04/12/22 Left 04/17/22 Left 04/19/22 Left 04/26/22 Left 05/01/22 supine Left 05/09/22  Left 05/15/22 Left 05/17/22 Left 05/21/22 Left 06/11/22  Knee flexion A: 130 A: 85 P: 90 A: 88 P:  94 AA: 101 A: 102 P: 105 A: 106 A: 97 P: 104 A: 100 P: 105 sitting A: 100 P: 102 A: 105 A: 110 A: 112 A; 112 sitting P: 115 supine  Knee extension A: 0 A: -10 P: -8 A: -8 P: -6  A: -8 P: -6  A: 0 (LAQ) A: 0 supine A:  A: 0  A: 0 A: 0   (Blank rows = not tested)  LOWER EXTREMITY MMT:  MMT Right 04/03/22 Left 04/03/22 Left 05/21/22  Hip flexion 5/5 5/5 5/5  Hip extension     Hip abduction     Hip adduction     Hip internal rotation     Hip external rotation     Knee flexion 5/5 3/5 4+/5  Knee extension 5/5 3/5 4+/5  Ankle dorsiflexion 5/5 5/5 4+/5   (Blank rows = not tested)    FUNCTIONAL TESTS:  06/11/22: 5 time sit to stand: 14 seconds no UE support  GAIT: Distance walked: 30 feet from clinic lobby to exam room Assistive device utilized: Single point cane Level of assistance: Modified independence Comments: antalgic gait pattern   TODAY'S TREATMENT                                                                          DATE: 06/11/22:  TherEx  Nustep L6 x8 minutes UE/LE  only  Calf stretch on slant board: x 2 x 30 sec BLE leg press 118# 2 x 15 Left LE leg press 50# 2 x 15  Forward step up on 6 inch step x 15 leading with left LE Lateral Step up and over x 15 each direction on 6 inch step c UE support Sit to stand x 5 no UE support  NMR Tandem stance x 3 holding 30 sec Standing on Airex x 2 feet together each 30 sec Walking lunges: 30 feet x 2 c close supervision Backward walking 20 feet x 2 c close supervision Box stepping x 10 each direction   05/28/22:  TherEx  Nustep L6 x8 minutes UE/LE  only  BLE leg press 118# 2 x 15 Left  LE leg press 62# 2 x 10, Rt LE 43 x 10, 37# x 10 Lateral Step up and over x 15 each direction on 6 inch step c UE support Eccentric sit to stands x8- limited by R knee pain  Hip adduction, hip flexion, hip abduction all c green TB x 15  Bil LE's  NMR Tandem walking 10 feet x 2 Stepping over 6 inch hurdles (6 hurdles x 2  reps)  05/23/22  TherEx  Nustep L6 x8 minutes BLEs only  BLE leg press 118# 2x12  Single LE leg press 56# 2x12  Forward step downs 4 inch box U UE support x15 Eccentric sit to stands x8- limited by R knee pain  3 way hip green TB x10 B  NMR  Tandem stance blue foam pad 3x30 seconds B  SLS with one foot on side of TM blue foam pad 3x30 seconds B    05/21/22 TherEx:  Nustep: Level 6 x 8 minutes Leg Press: 118# 2 x 10 bil LE's Leg Press: 56#  x 10 Left LE only Knee extension machine LLE 10#, bil LE's lifting and Left LE lowering Knee flexion machine LLE only 15# 2x10 Supine Heel slides for ROM measurements x 15  Hip abduction c Level 3 band 2 x 10 each LE There Activities:  Up and down 1 flight of stairs in clinic with single hand rail       PATIENT EDUCATION:  Education details: HEP, POC Person educated: Patient Education method: Programmer, multimedia, Demonstration, Verbal cues, and Handouts Education comprehension: verbalized understanding, returned demonstration, and verbal cues required  HOME EXERCISE PROGRAM: Access Code: [email or web address] URL: [email or web address] Date: 05/09/2022 Prepared by: Narda Amber  Exercises - Supine Quadricep Sets  - 3-4 x daily - 7 x weekly - 2 sets - 10 reps - 5 seconds hold - Supine Heel Slide with Strap  - 3-4 x daily - 7 x weekly - 10 reps - 5-10 seconds hold - Supine Active Straight Leg Raise  - 3-4 x daily - 7 x weekly - 2 sets - 10 reps - Seated Knee Flexion AAROM  - 3-4 x daily - 7 x weekly - 10 reps - 5-10 seconds hold - Sit to Stand with Counter Support  - 3-4 x daily - 7 x weekly - 10 reps - Seated Long Arc Quad  - 2 x daily - 7 x weekly - 2 sets - 10 reps - 5 seconds hold - Forward Step Up  - 2 x daily - 7 x weekly - 2 sets - 10 reps  ASSESSMENT:  CLINICAL IMPRESSION: Pt has made progress with active ROM of her left knee flexion to 112 degrees and passive ROM to 115 degrees. Pt reporting recent fall on 06/08/22 where  she lost her balance when stepping down steps. Pt stating she felt her knee buckle. Pt also reported hitting her head but didn't feel she needed to get seen by medical official. Pt is continuing to make progress with overall function. Pt's FOTO has improved to 67%.  Recommending continuing skilled PT interventions to maximize pt's function toward goals set.   OBJECTIVE IMPAIRMENTS: decreased activity tolerance, decreased balance, decreased mobility, difficulty walking, decreased ROM, decreased strength, increased edema, and pain.   ACTIVITY LIMITATIONS: bending, sitting, standing, squatting, sleeping, stairs, and transfers  PARTICIPATION LIMITATIONS: cleaning, laundry, and community activity  PERSONAL FACTORS: 3+ comorbidities: see above  are also affecting patient's functional outcome.   REHAB POTENTIAL: Good  CLINICAL DECISION MAKING: Stable/uncomplicated  EVALUATION COMPLEXITY: Low   GOALS: Goals reviewed with patient? Yes  SHORT TERM GOALS: (target date for Short term goals are 3 weeks 04/27/22)   1.  Patient will demonstrate independent use of home exercise program to maintain progress from in clinic treatments.  Goal status: MET 04/17/22  LONG TERM GOALS: (target dates for all long term goals are 12 weeks  06/29/22)   1. Patient will demonstrate/report pain at worst less than or equal to 2/10 to facilitate minimal limitation in daily activity secondary  to pain symptoms.  Goal status: On-going 05/28/22   2. Patient will demonstrate independent use of home exercise program to facilitate ability to maintain/progress functional gains from skilled physical therapy services.  Goal status: On-going 4/8//24   3. Patient will demonstrate FOTO outcome > or = 57 % to indicate reduced disability due to condition.  Goal status: MET 06/11/22   4.  Patient will demonstrate Left LE MMT 5/5 throughout to improve pt's functional mobility.   Goal status: On-going 06/11/22   5.  Patient will  demonstrate ability to navigate 1 flight of stairs with single hand rail with step over step gait pattern.  Goal status: On-going 06/11/22   6.  Pt will be able to perform sit to stand with no UE support from standard height chair.  Goal status: MET 05/21/22  7. Pt will improve her left knee active arc of motion from 2 to 115 degrees in order to improve functional mobility.   Goal Status: On-going 06/11/22 (met passively)       PLAN:  PT FREQUENCY: 2-3x/week  PT DURATION: 12 weeks  PLANNED INTERVENTIONS: Therapeutic exercises, Therapeutic activity, Neuro Muscular re-education, Balance training, Gait training, Patient/Family education, Joint mobilization, Stair training, DME instructions, Dry Needling, Electrical stimulation, Traction, Cryotherapy, vasopneumatic deviceMoist heat, Taping, Ultrasound, Ionotophoresis /ml Dexamethasone, and Manual therapy.  All included unless contraindicated  PLAN FOR NEXT SESSION: begin d/c planning after pt gets back from her vacation and consider dropping to 1x/ week for discharge,  prefers NuStep or SciFit bike,  Manual, standing strengthening and balance exercises,  Balance  NEXT MD VISIT: 06/13/22  Narda Amber, PT, MPT 06/11/22 11:11 AM     06/11/22 11:11 AM

## 2022-06-13 ENCOUNTER — Ambulatory Visit (INDEPENDENT_AMBULATORY_CARE_PROVIDER_SITE_OTHER): Payer: PPO | Admitting: Orthopaedic Surgery

## 2022-06-13 ENCOUNTER — Encounter: Payer: Self-pay | Admitting: Orthopaedic Surgery

## 2022-06-13 ENCOUNTER — Ambulatory Visit: Payer: PPO | Admitting: Physical Therapy

## 2022-06-13 ENCOUNTER — Encounter: Payer: Self-pay | Admitting: Physical Therapy

## 2022-06-13 DIAGNOSIS — R600 Localized edema: Secondary | ICD-10-CM

## 2022-06-13 DIAGNOSIS — M25662 Stiffness of left knee, not elsewhere classified: Secondary | ICD-10-CM | POA: Diagnosis not present

## 2022-06-13 DIAGNOSIS — M25562 Pain in left knee: Secondary | ICD-10-CM

## 2022-06-13 DIAGNOSIS — M6281 Muscle weakness (generalized): Secondary | ICD-10-CM | POA: Diagnosis not present

## 2022-06-13 DIAGNOSIS — Z96652 Presence of left artificial knee joint: Secondary | ICD-10-CM

## 2022-06-13 MED ORDER — AMOXICILLIN 500 MG PO CAPS: 2000.0000 mg | ORAL_CAPSULE | Freq: Once | ORAL | 6 refills | Status: AC

## 2022-06-13 NOTE — Therapy (Signed)
OUTPATIENT PHYSICAL THERAPY LOWER EXTREMITY TREATMENT NOTE    Patient Name: Kimberly Murray MRN: [id number] DOB:1950/07/01, 72 y.o., female Today's Date: 06/13/2022       END OF SESSION:  PT End of Session - 06/13/22 0941     Visit Number 18    Number of Visits 28    Date for PT Re-Evaluation 06/29/22    Progress Note Due on Visit 27    PT Start Time 0930    PT Stop Time 1010    PT Time Calculation (min) 40 min    Activity Tolerance Patient tolerated treatment well    Behavior During Therapy WFL for tasks assessed/performed                         Past Medical History:  Diagnosis Date   Allergic rhinitis    Chronic headaches    Complication of anesthesia    Depression    on antidepressent medication   Fatty liver    History of acute bronchitis    Hyperlipidemia    Hypertension    Neuropathy    in toes   PONV (postoperative nausea and vomiting)    mild nausea after gallbladder surgery   Pre-diabetes    Sleep apnea    Past Surgical History:  Procedure Laterality Date   BUNIONECTOMY Right    CATARACT EXTRACTION W/ INTRAOCULAR LENS IMPLANT Bilateral    CHOLECYSTECTOMY  2006   EYE SURGERY     macular surgery   LAPAROSCOPIC HYSTERECTOMY  2005   TOTAL KNEE ARTHROPLASTY Left 03/19/2022   Procedure: LEFT TOTAL KNEE ARTHROPLASTY;  Surgeon: Tarry Kos, MD;  Location: [facility] OR;  Service: Orthopedics;  Laterality: Left;   Patient Active Problem List   Diagnosis Date Noted   Primary osteoarthritis of left knee 03/19/2022   Status post total left knee replacement 03/19/2022   Headache 01/28/2019   Obstructive sleep apnea 08/01/2018   Insomnia 08/01/2018   GERD (gastroesophageal reflux disease) 08/01/2018    PCP: Lewis Moccasin, MD   REFERRING PROVIDER: Tarry Kos, MD  REFERRING DIAG: [phone] (ICD-10-CM) - Primary osteoarthritis of left knee [phone] (ICD-10-CM) - Status post total left knee replacement  THERAPY DIAG:  Acute pain of left  knee  Localized edema  Muscle weakness (generalized)  Stiffness of left knee, not elsewhere classified  Rationale for Evaluation and Treatment: Rehabilitation  ONSET DATE: left TKA on 03/19/22  SUBJECTIVE:   SUBJECTIVE STATEMENT: Pt stating her Rt knee is giving her more pain than her left. Pt stating mild pain in left lateral knee. Pt stating MD stated it's probably scar tissue. Pt with good report from Dr. Roda Shutters this morning. He recommended continue skilled PT.   PERTINENT HISTORY: Left TKA on 03/19/22, fatty liver, HTN, neuropathy, depression, pre-diabetes  PAIN:  NPRS scale: 2-3/10 Pain location: left lateral kne, Rt knee Pain description: achy, soreness Aggravating factors: bending Relieving factors: changing position, ice as needed  PRECAUTIONS: None  WEIGHT BEARING RESTRICTIONS: No  FALLS:  Has patient fallen in last 6 months? No  LIVING ENVIRONMENT: Lives with: lives with their family and lives with their spouse Lives in: House/apartment Stairs: Yes: External: 1 steps; none Has following equipment at home: None  OCCUPATION: retired  PLOF: Independent  PATIENT GOALS: walk better, comfortably, without device   OBJECTIVE:   DIAGNOSTIC FINDINGS:  Status post left total knee arthroplasty. No periprosthetic lucency or fracture. Near anatomic alignment of the prosthetic components. Air within the  soft tissues is not unexpected postoperatively.  PATIENT SURVEYS:  04/03/22: FOTO intake:  31%   05/11/22: FOTO 47 % 06/11/22: FOTO 67%   SENSATION: 04/03/22 WFL  EDEMA:  Circumferential: Rt:  37.5 centimeters        Left: 47 centimeters  POSTURE: rounded shoulders, forward head, and increased lumbar lordosis  PALPATION: 04/03/22: tender around joint line  LOWER EXTREMITY ROM:   ROM Right 04/03/22 Left 04/03/22 Left 04/10/22 Left 04/12/22 Left 04/17/22 Left 04/19/22 Left 04/26/22 Left 05/01/22 supine Left 05/09/22  Left 05/15/22 Left 05/17/22 Left 05/21/22  Left 06/11/22  Knee flexion A: 130 A: 85 P: 90 A: 88 P: 94 AA: 101 A: 102 P: 105 A: 106 A: 97 P: 104 A: 100 P: 105 sitting A: 100 P: 102 A: 105 A: 110 A: 112 A; 112 sitting P: 115 supine  Knee extension A: 0 A: -10 P: -8 A: -8 P: -6  A: -8 P: -6  A: 0 (LAQ) A: 0 supine A:  A: 0  A: 0 A: 0   (Blank rows = not tested)  LOWER EXTREMITY MMT:  MMT Right 04/03/22 Left 04/03/22 Left 05/21/22 Left 06/13/22  Hip flexion 5/5 5/5 5/5 5/5  Hip extension      Hip abduction      Hip adduction      Hip internal rotation      Hip external rotation      Knee flexion 5/5 3/5 4+/5 4+  Knee extension 5/5 3/5 4+/5 4+  Ankle dorsiflexion 5/5 5/5 4+/5 4+   (Blank rows = not tested)    FUNCTIONAL TESTS:  06/11/22: 5 time sit to stand: 14 seconds no UE support  GAIT: Distance walked: 30 feet from clinic lobby to exam room Assistive device utilized: Single point cane Level of assistance: Modified independence Comments: antalgic gait pattern   TODAY'S TREATMENT                                                                          DATE: 06/13/22:  TherEx Scifit: Level 3 x 8 minutes Calf stretch/hip flexor stretch in lunge position x 2 each LE x 20 sec BLE leg press 118#  2 x 15 Left LE leg press 56#  2 x 15  Seated SLR: x 15 left LE LAQ: 4# x 15 holding 5 sec NMR Tandem stance x 3 holding 30 sec Stepping over 6 inch hurdles (6 hurdles) x 4, side stepping over hurdles x 4  Figure 8 pattern around hurdles x 2 Stagger stance alternating using Blaze Pods to tap UE's x 4 reps of 20 seconds on Airex mat Up and down 2 flight stairs single hand rail  Manual Seated: IR and distraction with flexion    06/11/22:  TherEx  Nustep L6 x8 minutes UE/LE  only  Calf stretch on slant board: x 2 x 30 sec BLE leg press 118# 2 x 15 Left LE leg press 50# 2 x 15  Forward step up on 6 inch step x 15 leading with left LE Lateral Step up and over x 15 each direction on 6 inch step c UE support Sit  to stand x 5 no UE support  NMR Tandem stance x 3  holding 30 sec Standing on Airex x 2 feet together each 30 sec Walking lunges: 30 feet x 2 c close supervision Backward walking 20 feet x 2 c close supervision Box stepping x 10 each direction   05/28/22:  TherEx  Nustep L6 x8 minutes UE/LE  only  BLE leg press 118# 2 x 15 Left  LE leg press 62# 2 x 10, Rt LE 43 x 10, 37# x 10 Lateral Step up and over x 15 each direction on 6 inch step c UE support Eccentric sit to stands x8- limited by R knee pain  Hip adduction, hip flexion, hip abduction all c green TB x 15  Bil LE's  NMR Tandem walking 10 feet x 2 Stepping over 6 inch hurdles (6 hurdles x 2 reps)       PATIENT EDUCATION:  Education details: HEP, POC Person educated: Patient Education method: Programmer, multimedia, Demonstration, Verbal cues, and Handouts Education comprehension: verbalized understanding, returned demonstration, and verbal cues required  HOME EXERCISE PROGRAM: Access Code: [email or web address] URL: [email or web address] Date: 05/09/2022 Prepared by: Narda Amber  Exercises - Supine Quadricep Sets  - 3-4 x daily - 7 x weekly - 2 sets - 10 reps - 5 seconds hold - Supine Heel Slide with Strap  - 3-4 x daily - 7 x weekly - 10 reps - 5-10 seconds hold - Supine Active Straight Leg Raise  - 3-4 x daily - 7 x weekly - 2 sets - 10 reps - Seated Knee Flexion AAROM  - 3-4 x daily - 7 x weekly - 10 reps - 5-10 seconds hold - Sit to Stand with Counter Support  - 3-4 x daily - 7 x weekly - 10 reps - Seated Long Arc Quad  - 2 x daily - 7 x weekly - 2 sets - 10 reps - 5 seconds hold - Forward Step Up  - 2 x daily - 7 x weekly - 2 sets - 10 reps  ASSESSMENT:  CLINICAL IMPRESSION: Pt still feel like her balance is what she needs to work on the most. Treatment focusing on both LE strengthening and dynamic balance. Continue skilled PT interventions to maximize pt's function.   OBJECTIVE IMPAIRMENTS: decreased activity  tolerance, decreased balance, decreased mobility, difficulty walking, decreased ROM, decreased strength, increased edema, and pain.   ACTIVITY LIMITATIONS: bending, sitting, standing, squatting, sleeping, stairs, and transfers  PARTICIPATION LIMITATIONS: cleaning, laundry, and community activity  PERSONAL FACTORS: 3+ comorbidities: see above  are also affecting patient's functional outcome.   REHAB POTENTIAL: Good  CLINICAL DECISION MAKING: Stable/uncomplicated  EVALUATION COMPLEXITY: Low   GOALS: Goals reviewed with patient? Yes  SHORT TERM GOALS: (target date for Short term goals are 3 weeks 04/27/22)   1.  Patient will demonstrate independent use of home exercise program to maintain progress from in clinic treatments.  Goal status: MET 04/17/22  LONG TERM GOALS: (target dates for all long term goals are 12 weeks  06/29/22)   1. Patient will demonstrate/report pain at worst less than or equal to 2/10 to facilitate minimal limitation in daily activity secondary to pain symptoms.  Goal status: On-going 05/28/22   2. Patient will demonstrate independent use of home exercise program to facilitate ability to maintain/progress functional gains from skilled physical therapy services.  Goal status: On-going 4/8//24   3. Patient will demonstrate FOTO outcome > or = 57 % to indicate reduced disability due to condition.  Goal status: MET 06/11/22   4.  Patient will demonstrate Left LE MMT  5/5 throughout to improve pt's functional mobility.   Goal status: On-going 06/11/22   5.  Patient will demonstrate ability to navigate 1 flight of stairs with single hand rail with step over step gait pattern.  Goal status: On-going 06/11/22   6.  Pt will be able to perform sit to stand with no UE support from standard height chair.  Goal status: MET 05/21/22  7. Pt will improve her left knee active arc of motion from 2 to 115 degrees in order to improve functional mobility.   Goal Status: On-going  06/11/22 (met passively)       PLAN:  PT FREQUENCY: 2-3x/week  PT DURATION: 12 weeks  PLANNED INTERVENTIONS: Therapeutic exercises, Therapeutic activity, Neuro Muscular re-education, Balance training, Gait training, Patient/Family education, Joint mobilization, Stair training, DME instructions, Dry Needling, Electrical stimulation, Traction, Cryotherapy, vasopneumatic deviceMoist heat, Taping, Ultrasound, Ionotophoresis 4mg /ml Dexamethasone, and Manual therapy.  All included unless contraindicated  PLAN FOR NEXT SESSION: begin d/c planning after pt gets back from her vacation and consider dropping to 1x/ week for discharge,  prefers NuStep or SciFit bike,  Manual, standing strengthening and balance exercises,  Balance Try, SLS with toe touch on opposite foot to progress SLS independence   NEXT MD VISIT: f/u 3 months from 06/13/22  Narda Amber, PT, MPT 06/13/22 10:13 AM     06/13/22 10:13 AM

## 2022-06-13 NOTE — Progress Notes (Signed)
   Post-Op Visit Note   Patient: Kimberly Murray           Date of Birth: 12/19/1950           MRN: [id number] Visit Date: 06/13/2022 PCP: Lewis Moccasin, MD   Assessment & Plan:  Chief Complaint:  Chief Complaint  Patient presents with   Left Knee - Follow-up    Left total knee arthroplasty 03/19/2022   Visit Diagnoses:  1. Status post total left knee replacement     Plan: 3 month TKA follow up plan  Patient now 3 months status post left total knee arthroplasty.  Recently went to [place] for vacation.  No complaints about the left knee other than some occasional discomfort on the lateral aspect.  Wound is healed with no signs of complications or infection.  Range of motion is progressing nicely.  It was reinforced that prophylactic antibiotics should be taken with any procedure including but not limited to dental work or colonoscopies.  We will plan on following up at the 6 month postop visit with 2 view xrays of the operative knee at that time. As always, instructions were given to call with any questions or concerns in the interim.   Follow-Up Instructions: Return in about 3 months (around 09/12/2022).   Orders:  No orders of the defined types were placed in this encounter.  No orders of the defined types were placed in this encounter.   Imaging: No results found.  PMFS History: Patient Active Problem List   Diagnosis Date Noted   Primary osteoarthritis of left knee 03/19/2022   Status post total left knee replacement 03/19/2022   Headache 01/28/2019   Obstructive sleep apnea 08/01/2018   Insomnia 08/01/2018   GERD (gastroesophageal reflux disease) 08/01/2018   Past Medical History:  Diagnosis Date   Allergic rhinitis    Chronic headaches    Complication of anesthesia    Depression    on antidepressent medication   Fatty liver    History of acute bronchitis    Hyperlipidemia    Hypertension    Neuropathy    in toes   PONV (postoperative nausea and  vomiting)    mild nausea after gallbladder surgery   Pre-diabetes    Sleep apnea     No family history on file.  Past Surgical History:  Procedure Laterality Date   BUNIONECTOMY Right    CATARACT EXTRACTION W/ INTRAOCULAR LENS IMPLANT Bilateral    CHOLECYSTECTOMY  2006   EYE SURGERY     macular surgery   LAPAROSCOPIC HYSTERECTOMY  2005   TOTAL KNEE ARTHROPLASTY Left 03/19/2022   Procedure: LEFT TOTAL KNEE ARTHROPLASTY;  Surgeon: Tarry Kos, MD;  Location: [facility] OR;  Service: Orthopedics;  Laterality: Left;   Social History   Occupational History   Not on file  Tobacco Use   Smoking status: Never   Smokeless tobacco: Never  Vaping Use   Vaping Use: Never used  Substance and Sexual Activity   Alcohol use: Yes    Comment: once every couple of months   Drug use: Yes    Types: Marijuana    Comment: THC gummies- occasionally   Sexual activity: Not Currently

## 2022-06-18 NOTE — Therapy (Deleted)
OUTPATIENT PHYSICAL THERAPY LOWER EXTREMITY TREATMENT NOTE    Patient Name: Kimberly Murray MRN: [id number] DOB:04/12/1950, 72 y.o., female Today's Date: 06/18/2022       END OF SESSION:                Past Medical History:  Diagnosis Date   Allergic rhinitis    Chronic headaches    Complication of anesthesia    Depression    on antidepressent medication   Fatty liver    History of acute bronchitis    Hyperlipidemia    Hypertension    Neuropathy    in toes   PONV (postoperative nausea and vomiting)    mild nausea after gallbladder surgery   Pre-diabetes    Sleep apnea    Past Surgical History:  Procedure Laterality Date   BUNIONECTOMY Right    CATARACT EXTRACTION W/ INTRAOCULAR LENS IMPLANT Bilateral    CHOLECYSTECTOMY  2006   EYE SURGERY     macular surgery   LAPAROSCOPIC HYSTERECTOMY  2005   TOTAL KNEE ARTHROPLASTY Left 03/19/2022   Procedure: LEFT TOTAL KNEE ARTHROPLASTY;  Surgeon: Tarry Kos, MD;  Location: [facility] OR;  Service: Orthopedics;  Laterality: Left;   Patient Active Problem List   Diagnosis Date Noted   Primary osteoarthritis of left knee 03/19/2022   Status post total left knee replacement 03/19/2022   Headache 01/28/2019   Obstructive sleep apnea 08/01/2018   Insomnia 08/01/2018   GERD (gastroesophageal reflux disease) 08/01/2018    PCP: Lewis Moccasin, MD   REFERRING PROVIDER: Tarry Kos, MD  REFERRING DIAG: [phone] (ICD-10-CM) - Primary osteoarthritis of left knee [phone] (ICD-10-CM) - Status post total left knee replacement  THERAPY DIAG:  No diagnosis found.  Rationale for Evaluation and Treatment: Rehabilitation  ONSET DATE: left TKA on 03/19/22  SUBJECTIVE:   SUBJECTIVE STATEMENT: Pt stating her Rt knee is giving her more pain than her left. Pt stating mild pain in left lateral knee. Pt stating MD stated it's probably scar tissue. Pt with good report from Dr. Roda Shutters this morning. He recommended continue skilled PT.    PERTINENT HISTORY: Left TKA on 03/19/22, fatty liver, HTN, neuropathy, depression, pre-diabetes  PAIN:  NPRS scale: 2-3/10 Pain location: left lateral kne, Rt knee Pain description: achy, soreness Aggravating factors: bending Relieving factors: changing position, ice as needed  PRECAUTIONS: None  WEIGHT BEARING RESTRICTIONS: No  FALLS:  Has patient fallen in last 6 months? No  LIVING ENVIRONMENT: Lives with: lives with their family and lives with their spouse Lives in: House/apartment Stairs: Yes: External: 1 steps; none Has following equipment at home: None  OCCUPATION: retired  PLOF: Independent  PATIENT GOALS: walk better, comfortably, without device   OBJECTIVE:   DIAGNOSTIC FINDINGS:  Status post left total knee arthroplasty. No periprosthetic lucency or fracture. Near anatomic alignment of the prosthetic components. Air within the soft tissues is not unexpected postoperatively.  PATIENT SURVEYS:  04/03/22: FOTO intake:  31%   05/11/22: FOTO 47 % 06/11/22: FOTO 67%   SENSATION: 04/03/22 WFL  EDEMA:  Circumferential: Rt:  37.5 centimeters        Left: 47 centimeters  POSTURE: rounded shoulders, forward head, and increased lumbar lordosis  PALPATION: 04/03/22: tender around joint line  LOWER EXTREMITY ROM:   ROM Right 04/03/22 Left 04/03/22 Left 04/10/22 Left 04/12/22 Left 04/17/22 Left 04/19/22 Left 04/26/22 Left 05/01/22 supine Left 05/09/22  Left 05/15/22 Left 05/17/22 Left 05/21/22 Left 06/11/22  Knee flexion A: 130 A: 85 P:  90 A: 88 P: 94 AA: 101 A: 102 P: 105 A: 106 A: 97 P: 104 A: 100 P: 105 sitting A: 100 P: 102 A: 105 A: 110 A: 112 A; 112 sitting P: 115 supine  Knee extension A: 0 A: -10 P: -8 A: -8 P: -6  A: -8 P: -6  A: 0 (LAQ) A: 0 supine A:  A: 0  A: 0 A: 0   (Blank rows = not tested)  LOWER EXTREMITY MMT:  MMT Right 04/03/22 Left 04/03/22 Left 05/21/22 Left 06/13/22  Hip flexion 5/5 5/5 5/5 5/5  Hip extension      Hip  abduction      Hip adduction      Hip internal rotation      Hip external rotation      Knee flexion 5/5 3/5 4+/5 4+  Knee extension 5/5 3/5 4+/5 4+  Ankle dorsiflexion 5/5 5/5 4+/5 4+   (Blank rows = not tested)    FUNCTIONAL TESTS:  06/11/22: 5 time sit to stand: 14 seconds no UE support  GAIT: Distance walked: 30 feet from clinic lobby to exam room Assistive device utilized: Single point cane Level of assistance: Modified independence Comments: antalgic gait pattern   TODAY'S TREATMENT                                                                          DATE: 06/13/22:  TherEx Scifit: Level 3 x 8 minutes Calf stretch/hip flexor stretch in lunge position x 2 each LE x 20 sec BLE leg press 118#  2 x 15 Left LE leg press 56#  2 x 15  Seated SLR: x 15 left LE LAQ: 4# x 15 holding 5 sec NMR Tandem stance x 3 holding 30 sec Stepping over 6 inch hurdles (6 hurdles) x 4, side stepping over hurdles x 4  Figure 8 pattern around hurdles x 2 Stagger stance alternating using Blaze Pods to tap UE's x 4 reps of 20 seconds on Airex mat Up and down 2 flight stairs single hand rail  Manual Seated: IR and distraction with flexion    06/11/22:  TherEx  Nustep L6 x8 minutes UE/LE  only  Calf stretch on slant board: x 2 x 30 sec BLE leg press 118# 2 x 15 Left LE leg press 50# 2 x 15  Forward step up on 6 inch step x 15 leading with left LE Lateral Step up and over x 15 each direction on 6 inch step c UE support Sit to stand x 5 no UE support  NMR Tandem stance x 3 holding 30 sec Standing on Airex x 2 feet together each 30 sec Walking lunges: 30 feet x 2 c close supervision Backward walking 20 feet x 2 c close supervision Box stepping x 10 each direction   05/28/22:  TherEx  Nustep L6 x8 minutes UE/LE  only  BLE leg press 118# 2 x 15 Left  LE leg press 62# 2 x 10, Rt LE 43 x 10, 37# x 10 Lateral Step up and over x 15 each direction on 6 inch step c UE support Eccentric  sit to stands x8- limited by R knee pain  Hip adduction, hip flexion, hip  abduction all c green TB x 15  Bil LE's  NMR Tandem walking 10 feet x 2 Stepping over 6 inch hurdles (6 hurdles x 2 reps)       PATIENT EDUCATION:  Education details: HEP, POC Person educated: Patient Education method: Explanation, Demonstration, Verbal cues, and Handouts Education comprehension: verbalized understanding, returned demonstration, and verbal cues required  HOME EXERCISE PROGRAM: Access Code: [email or web address] URL: [email or web address] Date: 05/09/2022 Prepared by: Narda Amber  Exercises - Supine Quadricep Sets  - 3-4 x daily - 7 x weekly - 2 sets - 10 reps - 5 seconds hold - Supine Heel Slide with Strap  - 3-4 x daily - 7 x weekly - 10 reps - 5-10 seconds hold - Supine Active Straight Leg Raise  - 3-4 x daily - 7 x weekly - 2 sets - 10 reps - Seated Knee Flexion AAROM  - 3-4 x daily - 7 x weekly - 10 reps - 5-10 seconds hold - Sit to Stand with Counter Support  - 3-4 x daily - 7 x weekly - 10 reps - Seated Long Arc Quad  - 2 x daily - 7 x weekly - 2 sets - 10 reps - 5 seconds hold - Forward Step Up  - 2 x daily - 7 x weekly - 2 sets - 10 reps  ASSESSMENT:  CLINICAL IMPRESSION: Pt still feel like her balance is what she needs to work on the most. Treatment focusing on both LE strengthening and dynamic balance. Continue skilled PT interventions to maximize pt's function.   OBJECTIVE IMPAIRMENTS: decreased activity tolerance, decreased balance, decreased mobility, difficulty walking, decreased ROM, decreased strength, increased edema, and pain.   ACTIVITY LIMITATIONS: bending, sitting, standing, squatting, sleeping, stairs, and transfers  PARTICIPATION LIMITATIONS: cleaning, laundry, and community activity  PERSONAL FACTORS: 3+ comorbidities: see above  are also affecting patient's functional outcome.   REHAB POTENTIAL: Good  CLINICAL DECISION MAKING:  Stable/uncomplicated  EVALUATION COMPLEXITY: Low   GOALS: Goals reviewed with patient? Yes  SHORT TERM GOALS: (target date for Short term goals are 3 weeks 04/27/22)   1.  Patient will demonstrate independent use of home exercise program to maintain progress from in clinic treatments.  Goal status: MET 04/17/22  LONG TERM GOALS: (target dates for all long term goals are 12 weeks  06/29/22)   1. Patient will demonstrate/report pain at worst less than or equal to 2/10 to facilitate minimal limitation in daily activity secondary to pain symptoms.  Goal status: On-going 05/28/22   2. Patient will demonstrate independent use of home exercise program to facilitate ability to maintain/progress functional gains from skilled physical therapy services.  Goal status: On-going 4/8//24   3. Patient will demonstrate FOTO outcome > or = 57 % to indicate reduced disability due to condition.  Goal status: MET 06/11/22   4.  Patient will demonstrate Left LE MMT 5/5 throughout to improve pt's functional mobility.   Goal status: On-going 06/11/22   5.  Patient will demonstrate ability to navigate 1 flight of stairs with single hand rail with step over step gait pattern.  Goal status: On-going 06/11/22   6.  Pt will be able to perform sit to stand with no UE support from standard height chair.  Goal status: MET 05/21/22  7. Pt will improve her left knee active arc of motion from 2 to 115 degrees in order to improve functional mobility.   Goal Status: On-going 06/11/22 (met passively)       PLAN:  PT FREQUENCY: 2-3x/week  PT DURATION: 12 weeks  PLANNED INTERVENTIONS: Therapeutic exercises, Therapeutic activity, Neuro Muscular re-education, Balance training, Gait training, Patient/Family education, Joint mobilization, Stair training, DME instructions, Dry Needling, Electrical stimulation, Traction, Cryotherapy, vasopneumatic deviceMoist heat, Taping, Ultrasound, Ionotophoresis 4mg /ml Dexamethasone,  and Manual therapy.  All included unless contraindicated  PLAN FOR NEXT SESSION: begin d/c planning after pt gets back from her vacation and consider dropping to 1x/ week for discharge,  prefers NuStep or SciFit bike,  Manual, standing strengthening and balance exercises,  Balance Try, SLS with toe touch on opposite foot to progress SLS independence   NEXT MD VISIT: f/u 3 months from 06/13/22  Royal Hawthorn PT, DPT 06/18/22  3:38 PM

## 2022-06-19 ENCOUNTER — Encounter: Payer: PPO | Admitting: Physical Therapy

## 2022-06-25 ENCOUNTER — Encounter: Payer: Self-pay | Admitting: Physical Therapy

## 2022-06-25 ENCOUNTER — Ambulatory Visit: Payer: PPO | Admitting: Physical Therapy

## 2022-06-25 DIAGNOSIS — M6281 Muscle weakness (generalized): Secondary | ICD-10-CM | POA: Diagnosis not present

## 2022-06-25 DIAGNOSIS — M25562 Pain in left knee: Secondary | ICD-10-CM | POA: Diagnosis not present

## 2022-06-25 DIAGNOSIS — R600 Localized edema: Secondary | ICD-10-CM | POA: Diagnosis not present

## 2022-06-25 DIAGNOSIS — M25662 Stiffness of left knee, not elsewhere classified: Secondary | ICD-10-CM | POA: Diagnosis not present

## 2022-06-25 NOTE — Therapy (Signed)
OUTPATIENT PHYSICAL THERAPY LOWER EXTREMITY TREATMENT NOTE    Patient Name: Kimberly Murray MRN: [id number] DOB:1951-02-07, 72 y.o., female Today's Date: 06/25/2022       END OF SESSION:  PT End of Session - 06/25/22 0920     Visit Number 19    Number of Visits 28    Date for PT Re-Evaluation 06/29/22    Progress Note Due on Visit 27    PT Start Time 0847    PT Stop Time 0926    PT Time Calculation (min) 39 min    Activity Tolerance Patient tolerated treatment well    Behavior During Therapy WFL for tasks assessed/performed                          Past Medical History:  Diagnosis Date   Allergic rhinitis    Chronic headaches    Complication of anesthesia    Depression    on antidepressent medication   Fatty liver    History of acute bronchitis    Hyperlipidemia    Hypertension    Neuropathy    in toes   PONV (postoperative nausea and vomiting)    mild nausea after gallbladder surgery   Pre-diabetes    Sleep apnea    Past Surgical History:  Procedure Laterality Date   BUNIONECTOMY Right    CATARACT EXTRACTION W/ INTRAOCULAR LENS IMPLANT Bilateral    CHOLECYSTECTOMY  2006   EYE SURGERY     macular surgery   LAPAROSCOPIC HYSTERECTOMY  2005   TOTAL KNEE ARTHROPLASTY Left 03/19/2022   Procedure: LEFT TOTAL KNEE ARTHROPLASTY;  Surgeon: Tarry Kos, MD;  Location: [facility] OR;  Service: Orthopedics;  Laterality: Left;   Patient Active Problem List   Diagnosis Date Noted   Primary osteoarthritis of left knee 03/19/2022   Status post total left knee replacement 03/19/2022   Headache 01/28/2019   Obstructive sleep apnea 08/01/2018   Insomnia 08/01/2018   GERD (gastroesophageal reflux disease) 08/01/2018    PCP: Lewis Moccasin, MD   REFERRING PROVIDER: Tarry Kos, MD  REFERRING DIAG: [phone] (ICD-10-CM) - Primary osteoarthritis of left knee [phone] (ICD-10-CM) - Status post total left knee replacement  THERAPY DIAG:  Acute pain of left  knee  Localized edema  Muscle weakness (generalized)  Stiffness of left knee, not elsewhere classified  Rationale for Evaluation and Treatment: Rehabilitation  ONSET DATE: left TKA on 03/19/22  SUBJECTIVE:   SUBJECTIVE STATEMENT: Still reporting mild pain in her left knee with certain activities. Pt also reporting pain in Rt knee at times.   PERTINENT HISTORY: Left TKA on 03/19/22, fatty liver, HTN, neuropathy, depression, pre-diabetes  PAIN:  NPRS scale: 2-3/10 Pain location: left lateral kne, Rt knee Pain description: achy, soreness Aggravating factors: bending Relieving factors: changing position, ice as needed  PRECAUTIONS: None  WEIGHT BEARING RESTRICTIONS: No  FALLS:  Has patient fallen in last 6 months? No  LIVING ENVIRONMENT: Lives with: lives with their family and lives with their spouse Lives in: House/apartment Stairs: Yes: External: 1 steps; none Has following equipment at home: None  OCCUPATION: retired  PLOF: Independent  PATIENT GOALS: walk better, comfortably, without device   OBJECTIVE:   DIAGNOSTIC FINDINGS:  Status post left total knee arthroplasty. No periprosthetic lucency or fracture. Near anatomic alignment of the prosthetic components. Air within the soft tissues is not unexpected postoperatively.  PATIENT SURVEYS:  04/03/22: FOTO intake:  31%   05/11/22: FOTO 47 % 06/11/22:  FOTO 67%   SENSATION: 04/03/22 WFL  EDEMA:  Circumferential: Rt:  37.5 centimeters        Left: 47 centimeters  POSTURE: rounded shoulders, forward head, and increased lumbar lordosis  PALPATION: 04/03/22: tender around joint line  LOWER EXTREMITY ROM:   ROM Right 04/03/22 Left 04/03/22 Left 04/10/22 Left 04/12/22 Left 04/17/22 Left 04/19/22 Left 04/26/22 Left 05/01/22 supine Left 05/09/22  Left 05/15/22 Left 05/17/22 Left 05/21/22 Left 06/11/22  Knee flexion A: 130 A: 85 P: 90 A: 88 P: 94 AA: 101 A: 102 P: 105 A: 106 A: 97 P: 104 A: 100 P: 105  sitting A: 100 P: 102 A: 105 A: 110 A: 112 A; 112 sitting P: 115 supine  Knee extension A: 0 A: -10 P: -8 A: -8 P: -6  A: -8 P: -6  A: 0 (LAQ) A: 0 supine A:  A: 0  A: 0 A: 0   (Blank rows = not tested)  LOWER EXTREMITY MMT:  MMT Right 04/03/22 Left 04/03/22 Left 05/21/22 Left 06/13/22  Hip flexion 5/5 5/5 5/5 5/5  Hip extension      Hip abduction      Hip adduction      Hip internal rotation      Hip external rotation      Knee flexion 5/5 3/5 4+/5 4+  Knee extension 5/5 3/5 4+/5 4+  Ankle dorsiflexion 5/5 5/5 4+/5 4+   (Blank rows = not tested)    FUNCTIONAL TESTS:  06/11/22: 5 time sit to stand: 14 seconds no UE support  GAIT: Distance walked: 30 feet from clinic lobby to exam room Assistive device utilized: Single point cane Level of assistance: Modified independence Comments: antalgic gait pattern   TODAY'S TREATMENT                                                                          DATE: 06/25/22:  TherEx Nustep: level 6 x 8 minutes Calf stretch/hip flexor stretch in lunge position x 2 each LE x 20 sec BLE leg press 118#  2 x 15 Left LE leg press 56#  2 x 15  Step ups on 6 inch step x 20 c single UE support Step downs on 6 inch step: x 10 c single UE support Single leg Dead lift c 5 # kettle bell sitting on 6 inch step c single UE support for balance NMR Vectors using sliding disc: 3 cones place ant/lat, lat, and post/lat x 15 each LE with intermittent UE support Standing on Theradisc bil LE's x 1 minute with c UE support to maintain balance  06/13/22:  TherEx Scifit: Level 3 x 8 minutes Calf stretch/hip flexor stretch in lunge position x 2 each LE x 20 sec BLE leg press 118#  2 x 15 Left LE leg press 56#  2 x 15  Seated SLR: x 15 left LE LAQ: 4# x 15 holding 5 sec NMR Tandem stance x 3 holding 30 sec Stepping over 6 inch hurdles (6 hurdles) x 4, side stepping over hurdles x 4  Figure 8 pattern around hurdles x 2 Stagger stance alternating  using Blaze Pods to tap UE's x 4 reps of 20 seconds on Airex mat Up and  down 2 flight stairs single hand rail  Manual Seated: IR and distraction with flexion    06/11/22:  TherEx  Nustep L6 x8 minutes UE/LE  only  Calf stretch on slant board: x 2 x 30 sec BLE leg press 118# 2 x 15 Left LE leg press 50# 2 x 15  Forward step up on 6 inch step x 15 leading with left LE Lateral Step up and over x 15 each direction on 6 inch step c UE support Sit to stand x 5 no UE support  NMR Tandem stance x 3 holding 30 sec Standing on Airex x 2 feet together each 30 sec Walking lunges: 30 feet x 2 c close supervision Backward walking 20 feet x 2 c close supervision Box stepping x 10 each direction   05/28/22:  TherEx  Nustep L6 x8 minutes UE/LE  only  BLE leg press 118# 2 x 15 Left  LE leg press 62# 2 x 10, Rt LE 43 x 10, 37# x 10 Lateral Step up and over x 15 each direction on 6 inch step c UE support Eccentric sit to stands x8- limited by R knee pain  Hip adduction, hip flexion, hip abduction all c green TB x 15  Bil LE's  NMR Tandem walking 10 feet x 2 Stepping over 6 inch hurdles (6 hurdles x 2 reps)       PATIENT EDUCATION:  Education details: HEP, POC Person educated: Patient Education method: Programmer, multimedia, Demonstration, Verbal cues, and Handouts Education comprehension: verbalized understanding, returned demonstration, and verbal cues required  HOME EXERCISE PROGRAM: Access Code: [email or web address] URL: [email or web address] Date: 05/09/2022 Prepared by: Narda Amber  Exercises - Supine Quadricep Sets  - 3-4 x daily - 7 x weekly - 2 sets - 10 reps - 5 seconds hold - Supine Heel Slide with Strap  - 3-4 x daily - 7 x weekly - 10 reps - 5-10 seconds hold - Supine Active Straight Leg Raise  - 3-4 x daily - 7 x weekly - 2 sets - 10 reps - Seated Knee Flexion AAROM  - 3-4 x daily - 7 x weekly - 10 reps - 5-10 seconds hold - Sit to Stand with Counter Support  - 3-4 x  daily - 7 x weekly - 10 reps - Seated Long Arc Quad  - 2 x daily - 7 x weekly - 2 sets - 10 reps - 5 seconds hold - Forward Step Up  - 2 x daily - 7 x weekly - 2 sets - 10 reps  ASSESSMENT:  CLINICAL IMPRESSION: Pt with good compliance to treatment today. Pt required 2 rest breaks due to cough. Continue skilled PT interventions to maximize pt's function with improved strength and balance.   OBJECTIVE IMPAIRMENTS: decreased activity tolerance, decreased balance, decreased mobility, difficulty walking, decreased ROM, decreased strength, increased edema, and pain.   ACTIVITY LIMITATIONS: bending, sitting, standing, squatting, sleeping, stairs, and transfers  PARTICIPATION LIMITATIONS: cleaning, laundry, and community activity  PERSONAL FACTORS: 3+ comorbidities: see above  are also affecting patient's functional outcome.   REHAB POTENTIAL: Good  CLINICAL DECISION MAKING: Stable/uncomplicated  EVALUATION COMPLEXITY: Low   GOALS: Goals reviewed with patient? Yes  SHORT TERM GOALS: (target date for Short term goals are 3 weeks 04/27/22)   1.  Patient will demonstrate independent use of home exercise program to maintain progress from in clinic treatments.  Goal status: MET 04/17/22  LONG TERM GOALS: (target dates for all long term goals are 12 weeks  06/29/22)  1. Patient will demonstrate/report pain at worst less than or equal to 2/10 to facilitate minimal limitation in daily activity secondary to pain symptoms.  Goal status: On-going 05/28/22   2. Patient will demonstrate independent use of home exercise program to facilitate ability to maintain/progress functional gains from skilled physical therapy services.  Goal status: On-going 4/8//24   3. Patient will demonstrate FOTO outcome > or = 57 % to indicate reduced disability due to condition.  Goal status: MET 06/11/22   4.  Patient will demonstrate Left LE MMT 5/5 throughout to improve pt's functional mobility.   Goal status:  On-going 06/11/22   5.  Patient will demonstrate ability to navigate 1 flight of stairs with single hand rail with step over step gait pattern.  Goal status: On-going 06/11/22   6.  Pt will be able to perform sit to stand with no UE support from standard height chair.  Goal status: MET 05/21/22  7. Pt will improve her left knee active arc of motion from 2 to 115 degrees in order to improve functional mobility.   Goal Status: On-going 06/11/22 (met passively)       PLAN:  PT FREQUENCY: 2-3x/week  PT DURATION: 12 weeks  PLANNED INTERVENTIONS: Therapeutic exercises, Therapeutic activity, Neuro Muscular re-education, Balance training, Gait training, Patient/Family education, Joint mobilization, Stair training, DME instructions, Dry Needling, Electrical stimulation, Traction, Cryotherapy, vasopneumatic deviceMoist heat, Taping, Ultrasound, Ionotophoresis 4mg /ml Dexamethasone, and Manual therapy.  All included unless contraindicated  PLAN FOR NEXT SESSION: begin d/c planning after pt gets back from her vacation and consider dropping to 1x/ week for discharge,  prefers NuStep or SciFit bike,  Manual, standing strengthening and balance exercises,  Balance Try, SLS with toe touch on opposite foot to progress SLS independence   NEXT MD VISIT: f/u 3 months from 06/13/22  Narda Amber, PT, MPT 06/25/22 9:27 AM     06/25/22 9:27 AM

## 2022-06-27 ENCOUNTER — Encounter: Payer: Self-pay | Admitting: Physical Therapy

## 2022-06-27 ENCOUNTER — Ambulatory Visit: Payer: PPO | Admitting: Physical Therapy

## 2022-06-27 DIAGNOSIS — M25662 Stiffness of left knee, not elsewhere classified: Secondary | ICD-10-CM | POA: Diagnosis not present

## 2022-06-27 DIAGNOSIS — M6281 Muscle weakness (generalized): Secondary | ICD-10-CM | POA: Diagnosis not present

## 2022-06-27 DIAGNOSIS — R600 Localized edema: Secondary | ICD-10-CM | POA: Diagnosis not present

## 2022-06-27 DIAGNOSIS — M25562 Pain in left knee: Secondary | ICD-10-CM | POA: Diagnosis not present

## 2022-06-27 NOTE — Therapy (Signed)
OUTPATIENT PHYSICAL THERAPY LOWER EXTREMITY TREATMENT NOTE DISCHARGE SUMMARY    Patient Name: Kimberly Murray MRN: [id number] DOB:[birth year], 72 y.o., female Today's Date: 06/27/2022       END OF SESSION:  PT End of Session - 06/27/22 0759     Visit Number 20    Number of Visits --    Date for PT Re-Evaluation --    Progress Note Due on Visit --    PT Start Time 0800    PT Stop Time 0825    PT Time Calculation (min) 25 min    Activity Tolerance Patient tolerated treatment well    Behavior During Therapy WFL for tasks assessed/performed                           Past Medical History:  Diagnosis Date   Allergic rhinitis    Chronic headaches    Complication of anesthesia    Depression    on antidepressent medication   Fatty liver    History of acute bronchitis    Hyperlipidemia    Hypertension    Neuropathy    in toes   PONV (postoperative nausea and vomiting)    mild nausea after gallbladder surgery   Pre-diabetes    Sleep apnea    Past Surgical History:  Procedure Laterality Date   BUNIONECTOMY Right    CATARACT EXTRACTION W/ INTRAOCULAR LENS IMPLANT Bilateral    CHOLECYSTECTOMY  2006   EYE SURGERY     macular surgery   LAPAROSCOPIC HYSTERECTOMY  2005   TOTAL KNEE ARTHROPLASTY Left 03/19/2022   Procedure: LEFT TOTAL KNEE ARTHROPLASTY;  Surgeon: Tarry Kos, MD;  Location: [facility] OR;  Service: Orthopedics;  Laterality: Left;   Patient Active Problem List   Diagnosis Date Noted   Primary osteoarthritis of left knee 03/19/2022   Status post total left knee replacement 03/19/2022   Headache 01/28/2019   Obstructive sleep apnea 08/01/2018   Insomnia 08/01/2018   GERD (gastroesophageal reflux disease) 08/01/2018    PCP: Lewis Moccasin, MD   REFERRING PROVIDER: Tarry Kos, MD  REFERRING DIAG: [phone] (ICD-10-CM) - Primary osteoarthritis of left knee [phone] (ICD-10-CM) - Status post total left knee replacement  THERAPY DIAG:  Acute  pain of left knee  Localized edema  Muscle weakness (generalized)  Stiffness of left knee, not elsewhere classified  Rationale for Evaluation and Treatment: Rehabilitation  ONSET DATE: left TKA on 03/19/22  SUBJECTIVE:   SUBJECTIVE STATEMENT: Doing pretty well, only has pain with increased activity  PERTINENT HISTORY: Left TKA on 03/19/22, fatty liver, HTN, neuropathy, depression, pre-diabetes  PAIN:  NPRS scale: 2-3/10 Pain location: left lateral kne, Rt knee Pain description: achy, soreness Aggravating factors: bending Relieving factors: changing position, ice as needed  PRECAUTIONS: None  WEIGHT BEARING RESTRICTIONS: No  FALLS:  Has patient fallen in last 6 months? No  LIVING ENVIRONMENT: Lives with: lives with their family and lives with their spouse Lives in: House/apartment Stairs: Yes: External: 1 steps; none Has following equipment at home: None  OCCUPATION: retired  PLOF: Independent  PATIENT GOALS: walk better, comfortably, without device   OBJECTIVE:   DIAGNOSTIC FINDINGS:  Status post left total knee arthroplasty. No periprosthetic lucency or fracture. Near anatomic alignment of the prosthetic components. Air within the soft tissues is not unexpected postoperatively.  PATIENT SURVEYS:  04/03/22: FOTO intake:  31%   05/11/22: FOTO 47 % 06/11/22: FOTO 67%   SENSATION: 04/03/22 WFL  EDEMA:  Circumferential: Rt:  37.5 centimeters        Left: 47 centimeters  POSTURE: rounded shoulders, forward head, and increased lumbar lordosis  PALPATION: 04/03/22: tender around joint line  LOWER EXTREMITY ROM:   ROM Right 04/03/22 Left 04/03/22 Left 04/10/22 Left 04/12/22 Left 04/17/22 Left 04/19/22 Left 04/26/22 Left 05/01/22 supine Left 05/09/22  Left 05/15/22 Left 05/17/22 Left 05/21/22 Left 06/11/22 Left 06/27/22  Knee flexion A: 130 A: 85 P: 90 A: 88 P: 94 AA: 101 A: 102 P: 105 A: 106 A: 97 P: 104 A: 100 P: 105 sitting A: 100 P: 102 A: 105 A:  110 A: 112 A; 112 sitting P: 115 supine A: 115  Knee extension A: 0 A: -10 P: -8 A: -8 P: -6  A: -8 P: -6  A: 0 (LAQ) A: 0 supine A:  A: 0  A: 0 A: 0 A: 0   (Blank rows = not tested)  LOWER EXTREMITY MMT:  MMT Right 04/03/22 Left 04/03/22 Left 05/21/22 Left 06/13/22 Left 06/27/22  Hip flexion 5/5 5/5 5/5 5/5   Knee flexion 5/5 3/5 4+/5 4+ 5/5  Knee extension 5/5 3/5 4+/5 4+ 5/5  Ankle dorsiflexion 5/5 5/5 4+/5 4+    (Blank rows = not tested)    FUNCTIONAL TESTS:  06/11/22: 5 times sit to stand: 14 seconds no UE support  GAIT: Distance walked: 30 feet from clinic lobby to exam room Assistive device utilized: Single point cane Level of assistance: Modified independence Comments: antalgic gait pattern   TODAY'S TREATMENT DATE: 06/27/22 TherEx NuStep: level 6 x 8 minutes MMT and ROM - see above for details Stair negotiation with single hand rail and reciprocal pattern -  modified independent without significant deviations Review and addition of gym HEP - pt verbalized understanding  06/25/22:  TherEx Nustep: level 6 x 8 minutes Calf stretch/hip flexor stretch in lunge position x 2 each LE x 20 sec BLE leg press 118#  2 x 15 Left LE leg press 56#  2 x 15  Step ups on 6 inch step x 20 c single UE support Step downs on 6 inch step: x 10 c single UE support Single leg Dead lift c 5 # kettle bell sitting on 6 inch step c single UE support for balance NMR Vectors using sliding disc: 3 cones place ant/lat, lat, and post/lat x 15 each LE with intermittent UE support Standing on Theradisc bil LE's x 1 minute with c UE support to maintain balance  06/13/22:  TherEx Scifit: Level 3 x 8 minutes Calf stretch/hip flexor stretch in lunge position x 2 each LE x 20 sec BLE leg press 118#  2 x 15 Left LE leg press 56#  2 x 15  Seated SLR: x 15 left LE LAQ: 4# x 15 holding 5 sec NMR Tandem stance x 3 holding 30 sec Stepping over 6 inch hurdles (6 hurdles) x 4, side stepping over  hurdles x 4  Figure 8 pattern around hurdles x 2 Stagger stance alternating using Blaze Pods to tap UE's x 4 reps of 20 seconds on Airex mat Up and down 2 flight stairs single hand rail  Manual Seated: IR and distraction with flexion    06/11/22:  TherEx  Nustep L6 x8 minutes UE/LE  only  Calf stretch on slant board: x 2 x 30 sec BLE leg press 118# 2 x 15 Left LE leg press 50# 2 x 15  Forward step up on 6 inch step x 15  leading with left LE Lateral Step up and over x 15 each direction on 6 inch step c UE support Sit to stand x 5 no UE support  NMR Tandem stance x 3 holding 30 sec Standing on Airex x 2 feet together each 30 sec Walking lunges: 30 feet x 2 c close supervision Backward walking 20 feet x 2 c close supervision Box stepping x 10 each direction    PATIENT EDUCATION:  Education details: HEP, POC Person educated: Patient Education method: Explanation, Demonstration, Verbal cues, and Handouts Education comprehension: verbalized understanding, returned demonstration, and verbal cues required  HOME EXERCISE PROGRAM: Access Code: [email or web address] URL: [email or web address] Date: 05/09/2022 Prepared by: Narda Amber  Exercises - Supine Quadricep Sets  - 3-4 x daily - 7 x weekly - 2 sets - 10 reps - 5 seconds hold - Supine Heel Slide with Strap  - 3-4 x daily - 7 x weekly - 10 reps - 5-10 seconds hold - Supine Active Straight Leg Raise  - 3-4 x daily - 7 x weekly - 2 sets - 10 reps - Seated Knee Flexion AAROM  - 3-4 x daily - 7 x weekly - 10 reps - 5-10 seconds hold - Sit to Stand with Counter Support  - 3-4 x daily - 7 x weekly - 10 reps - Seated Long Arc Quad  - 2 x daily - 7 x weekly - 2 sets - 10 reps - 5 seconds hold - Forward Step Up  - 2 x daily - 7 x weekly - 2 sets - 10 reps  Gym Program Access Code: [email or web address] URL: [email or web address] Date: 06/27/2022 Prepared by: Moshe Cipro  Exercises - Single Leg Knee Extension with  Weight Machine  - 1 x daily - 7 x weekly - 3 sets - 10 reps - Single Leg Hamstring Curl with Weight Machine  - 1 x daily - 7 x weekly - 3 sets - 10 reps - Single Leg Press  - 1 x daily - 7 x weekly - 3 sets - 10 reps - Hip Abduction Machine  - 1 x daily - 7 x weekly - 3 sets - 10 reps - Hip Adduction Machine  - 1 x daily - 7 x weekly - 3 sets - 10 reps - Standing Hip Circles at [employer]  - 1 x daily - 7 x weekly - 3 sets - 10 reps - Backward Walking  - 1 x daily - 7 x weekly - 3 sets - 10 reps - Squat  - 1 x daily - 7 x weekly - 3 sets - 10 reps - Side Stepping  - 1 x daily - 7 x weekly - 3 sets - 10 reps - Standing Hip Abduction  - 1 x daily - 7 x weekly - 3 sets - 10 reps - Forward Backward Tandem Walking  - 1 x daily - 7 x weekly - 3 sets - 10 reps - Jumping Jacks  - 1 x daily - 7 x weekly - 3 sets - 10 reps - Single Leg Squat  - 1 x daily - 7 x weekly - 3 sets - 10 reps - Alternating Backward Lunge  - 1 x daily - 7 x weekly - 3 sets - 10 reps  ASSESSMENT:  CLINICAL IMPRESSION: Pt has met all goals and is ready for d/c from PT with transition to home and community fitness.  Will d/c PT today.  OBJECTIVE IMPAIRMENTS: decreased activity tolerance, decreased balance, decreased mobility, difficulty walking, decreased  ROM, decreased strength, increased edema, and pain.   ACTIVITY LIMITATIONS: bending, sitting, standing, squatting, sleeping, stairs, and transfers  PARTICIPATION LIMITATIONS: cleaning, laundry, and community activity  PERSONAL FACTORS: 3+ comorbidities: see above  are also affecting patient's functional outcome.   REHAB POTENTIAL: Good  CLINICAL DECISION MAKING: Stable/uncomplicated  EVALUATION COMPLEXITY: Low   GOALS: Goals reviewed with patient? Yes  SHORT TERM GOALS: (target date for Short term goals are 3 weeks 04/27/22)   1.  Patient will demonstrate independent use of home exercise program to maintain progress from in clinic treatments.  Goal status: MET  04/17/22  LONG TERM GOALS: (target dates for all long term goals are 12 weeks  06/29/22)   1. Patient will demonstrate/report pain at worst less than or equal to 2/10 to facilitate minimal limitation in daily activity secondary to pain symptoms.  Goal status: MET 06/27/22   2. Patient will demonstrate independent use of home exercise program to facilitate ability to maintain/progress functional gains from skilled physical therapy services.  Goal status: MET 06/27/22   3. Patient will demonstrate FOTO outcome > or = 57 % to indicate reduced disability due to condition.  Goal status: MET 06/11/22   4.  Patient will demonstrate Left LE MMT 5/5 throughout to improve pt's functional mobility.   Goal status: MET 06/27/22   5.  Patient will demonstrate ability to navigate 1 flight of stairs with single hand rail with step over step gait pattern.  Goal status:  MET 06/27/22   6.  Pt will be able to perform sit to stand with no UE support from standard height chair.  Goal status: MET 05/21/22  7. Pt will improve her left knee active arc of motion from 2 to 115 degrees in order to improve functional mobility.  Goal Status:  MET 06/27/22       PLAN:  PT FREQUENCY: 2-3x/week  PT DURATION: 12 weeks  PLANNED INTERVENTIONS: Therapeutic exercises, Therapeutic activity, Neuro Muscular re-education, Balance training, Gait training, Patient/Family education, Joint mobilization, Stair training, DME instructions, Dry Needling, Electrical stimulation, Traction, Cryotherapy, vasopneumatic deviceMoist heat, Taping, Ultrasound, Ionotophoresis 4mg /ml Dexamethasone, and Manual therapy.  All included unless contraindicated  PLAN FOR NEXT SESSION: d/c PT today   NEXT MD VISIT: f/u 3 months from 06/13/22  Clarita Crane, PT, DPT 06/27/22 8:37 AM   PHYSICAL THERAPY DISCHARGE SUMMARY  Visits from Start of Care: 20  Current functional level related to goals / functional outcomes: See above   Remaining  deficits: See above   Education / Equipment: HEP   Patient agrees to discharge. Patient goals were met. Patient is being discharged due to meeting the stated rehab goals.  Clarita Crane, PT, DPT 06/27/22 8:37 AM  [facility] [address] [place], [place], [address] Phone: [phone]   Fax:  [phone]

## 2022-08-09 DIAGNOSIS — I10 Essential (primary) hypertension: Secondary | ICD-10-CM | POA: Diagnosis not present

## 2022-08-09 DIAGNOSIS — E1165 Type 2 diabetes mellitus with hyperglycemia: Secondary | ICD-10-CM | POA: Diagnosis not present

## 2022-08-09 DIAGNOSIS — E559 Vitamin D deficiency, unspecified: Secondary | ICD-10-CM | POA: Diagnosis not present

## 2022-08-09 DIAGNOSIS — E538 Deficiency of other specified B group vitamins: Secondary | ICD-10-CM | POA: Diagnosis not present

## 2022-08-15 DIAGNOSIS — M1711 Unilateral primary osteoarthritis, right knee: Secondary | ICD-10-CM | POA: Diagnosis not present

## 2022-08-15 DIAGNOSIS — E1165 Type 2 diabetes mellitus with hyperglycemia: Secondary | ICD-10-CM | POA: Diagnosis not present

## 2022-08-15 DIAGNOSIS — E559 Vitamin D deficiency, unspecified: Secondary | ICD-10-CM | POA: Diagnosis not present

## 2022-08-15 DIAGNOSIS — E1122 Type 2 diabetes mellitus with diabetic chronic kidney disease: Secondary | ICD-10-CM | POA: Diagnosis not present

## 2022-08-15 DIAGNOSIS — E1121 Type 2 diabetes mellitus with diabetic nephropathy: Secondary | ICD-10-CM | POA: Diagnosis not present

## 2022-08-15 DIAGNOSIS — E538 Deficiency of other specified B group vitamins: Secondary | ICD-10-CM | POA: Diagnosis not present

## 2022-08-15 DIAGNOSIS — I10 Essential (primary) hypertension: Secondary | ICD-10-CM | POA: Diagnosis not present

## 2022-08-15 DIAGNOSIS — Z789 Other specified health status: Secondary | ICD-10-CM | POA: Diagnosis not present

## 2022-08-15 DIAGNOSIS — E782 Mixed hyperlipidemia: Secondary | ICD-10-CM | POA: Diagnosis not present

## 2022-08-15 DIAGNOSIS — I129 Hypertensive chronic kidney disease with stage 1 through stage 4 chronic kidney disease, or unspecified chronic kidney disease: Secondary | ICD-10-CM | POA: Diagnosis not present

## 2022-08-15 DIAGNOSIS — G720 Drug-induced myopathy: Secondary | ICD-10-CM | POA: Diagnosis not present

## 2022-08-29 DIAGNOSIS — M1711 Unilateral primary osteoarthritis, right knee: Secondary | ICD-10-CM | POA: Diagnosis not present

## 2022-09-05 ENCOUNTER — Ambulatory Visit: Payer: PPO | Admitting: Orthopaedic Surgery

## 2022-09-05 DIAGNOSIS — M1711 Unilateral primary osteoarthritis, right knee: Secondary | ICD-10-CM | POA: Diagnosis not present

## 2022-09-12 DIAGNOSIS — M1711 Unilateral primary osteoarthritis, right knee: Secondary | ICD-10-CM | POA: Diagnosis not present

## 2022-09-19 ENCOUNTER — Other Ambulatory Visit (INDEPENDENT_AMBULATORY_CARE_PROVIDER_SITE_OTHER): Payer: PPO

## 2022-09-19 ENCOUNTER — Ambulatory Visit: Payer: PPO | Admitting: Physician Assistant

## 2022-09-19 DIAGNOSIS — M1711 Unilateral primary osteoarthritis, right knee: Secondary | ICD-10-CM | POA: Diagnosis not present

## 2022-09-19 DIAGNOSIS — Z96652 Presence of left artificial knee joint: Secondary | ICD-10-CM | POA: Diagnosis not present

## 2022-09-19 NOTE — Progress Notes (Signed)
   Post-Op Visit Note   Patient: Kimberly Murray           Date of Birth: 22-Jun-1950           MRN: [id number] Visit Date: 09/19/2022 PCP: Lewis Moccasin, MD   Assessment & Plan:  Chief Complaint:  Chief Complaint  Patient presents with   Left Knee - Pain   Visit Diagnoses:  1. Status post total left knee replacement     Plan: Patient is a pleasant 72 year old female who comes in today 6 months status post left total knee replacement 03/19/2022.  She has been doing well.  She notes some swelling with activity that his improved over time.  She notes occasional jabs at night which have become less frequent.  Overall, doing well.  Semination of the left knee reveals a fully healed surgical scar without complication.  Range of motion 0 to 115 degrees.  She is stable to valgus and varus stress.  She is neurovascularly intact distally.  At this point, she will continue to work on range of motion exercises.  Dental prophylaxis reinforced.  Follow-up in 6 months for repeat evaluation and 2 view x-rays of the left knee.  Call with concerns or questions.  Follow-Up Instructions: Return in about 6 months (around 03/22/2023).   Orders:  Orders Placed This Encounter  Procedures   XR Knee 1-2 Views Left   No orders of the defined types were placed in this encounter.   Imaging: XR Knee 1-2 Views Left  Result Date: 09/19/2022 Well-seated prosthesis without complication   PMFS History: Patient Active Problem List   Diagnosis Date Noted   Primary osteoarthritis of left knee 03/19/2022   Status post total left knee replacement 03/19/2022   Headache 01/28/2019   Obstructive sleep apnea 08/01/2018   Insomnia 08/01/2018   GERD (gastroesophageal reflux disease) 08/01/2018   Past Medical History:  Diagnosis Date   Allergic rhinitis    Chronic headaches    Complication of anesthesia    Depression    on antidepressent medication   Fatty liver    History of acute bronchitis     Hyperlipidemia    Hypertension    Neuropathy    in toes   PONV (postoperative nausea and vomiting)    mild nausea after gallbladder surgery   Pre-diabetes    Sleep apnea     No family history on file.  Past Surgical History:  Procedure Laterality Date   BUNIONECTOMY Right    CATARACT EXTRACTION W/ INTRAOCULAR LENS IMPLANT Bilateral    CHOLECYSTECTOMY  2006   EYE SURGERY     macular surgery   LAPAROSCOPIC HYSTERECTOMY  2005   TOTAL KNEE ARTHROPLASTY Left 03/19/2022   Procedure: LEFT TOTAL KNEE ARTHROPLASTY;  Surgeon: Tarry Kos, MD;  Location: [facility] OR;  Service: Orthopedics;  Laterality: Left;   Social History   Occupational History   Not on file  Tobacco Use   Smoking status: Never   Smokeless tobacco: Never  Vaping Use   Vaping status: Never Used  Substance and Sexual Activity   Alcohol use: Yes    Comment: once every couple of months   Drug use: Yes    Types: Marijuana    Comment: THC gummies- occasionally   Sexual activity: Not Currently

## 2022-11-15 DIAGNOSIS — I10 Essential (primary) hypertension: Secondary | ICD-10-CM | POA: Diagnosis not present

## 2022-11-15 DIAGNOSIS — J309 Allergic rhinitis, unspecified: Secondary | ICD-10-CM | POA: Diagnosis not present

## 2022-11-15 DIAGNOSIS — Z23 Encounter for immunization: Secondary | ICD-10-CM | POA: Diagnosis not present

## 2022-11-15 DIAGNOSIS — F411 Generalized anxiety disorder: Secondary | ICD-10-CM | POA: Diagnosis not present

## 2022-11-15 DIAGNOSIS — G4700 Insomnia, unspecified: Secondary | ICD-10-CM | POA: Diagnosis not present

## 2022-11-15 DIAGNOSIS — E1165 Type 2 diabetes mellitus with hyperglycemia: Secondary | ICD-10-CM | POA: Diagnosis not present

## 2022-11-15 DIAGNOSIS — F331 Major depressive disorder, recurrent, moderate: Secondary | ICD-10-CM | POA: Diagnosis not present

## 2022-11-15 DIAGNOSIS — R5383 Other fatigue: Secondary | ICD-10-CM | POA: Diagnosis not present

## 2022-11-15 DIAGNOSIS — G4733 Obstructive sleep apnea (adult) (pediatric): Secondary | ICD-10-CM | POA: Diagnosis not present

## 2022-11-16 DIAGNOSIS — H524 Presbyopia: Secondary | ICD-10-CM | POA: Diagnosis not present

## 2022-11-16 DIAGNOSIS — Z961 Presence of intraocular lens: Secondary | ICD-10-CM | POA: Diagnosis not present

## 2022-11-16 DIAGNOSIS — H02403 Unspecified ptosis of bilateral eyelids: Secondary | ICD-10-CM | POA: Diagnosis not present

## 2022-11-20 DIAGNOSIS — I10 Essential (primary) hypertension: Secondary | ICD-10-CM | POA: Diagnosis not present

## 2022-11-20 DIAGNOSIS — F3342 Major depressive disorder, recurrent, in full remission: Secondary | ICD-10-CM | POA: Diagnosis not present

## 2022-11-20 DIAGNOSIS — E1142 Type 2 diabetes mellitus with diabetic polyneuropathy: Secondary | ICD-10-CM | POA: Diagnosis not present

## 2022-11-20 DIAGNOSIS — F419 Anxiety disorder, unspecified: Secondary | ICD-10-CM | POA: Diagnosis not present

## 2022-11-20 DIAGNOSIS — E1169 Type 2 diabetes mellitus with other specified complication: Secondary | ICD-10-CM | POA: Diagnosis not present

## 2022-11-20 DIAGNOSIS — G4733 Obstructive sleep apnea (adult) (pediatric): Secondary | ICD-10-CM | POA: Diagnosis not present

## 2022-11-20 DIAGNOSIS — J309 Allergic rhinitis, unspecified: Secondary | ICD-10-CM | POA: Diagnosis not present

## 2022-11-20 DIAGNOSIS — G8929 Other chronic pain: Secondary | ICD-10-CM | POA: Diagnosis not present

## 2022-11-20 DIAGNOSIS — E785 Hyperlipidemia, unspecified: Secondary | ICD-10-CM | POA: Diagnosis not present

## 2022-11-20 DIAGNOSIS — G43909 Migraine, unspecified, not intractable, without status migrainosus: Secondary | ICD-10-CM | POA: Diagnosis not present

## 2022-11-20 DIAGNOSIS — E559 Vitamin D deficiency, unspecified: Secondary | ICD-10-CM | POA: Diagnosis not present

## 2022-11-21 DIAGNOSIS — Z23 Encounter for immunization: Secondary | ICD-10-CM | POA: Diagnosis not present

## 2022-12-24 DIAGNOSIS — Z0000 Encounter for general adult medical examination without abnormal findings: Secondary | ICD-10-CM | POA: Diagnosis not present

## 2022-12-24 DIAGNOSIS — Z1331 Encounter for screening for depression: Secondary | ICD-10-CM | POA: Diagnosis not present

## 2022-12-24 DIAGNOSIS — Z1339 Encounter for screening examination for other mental health and behavioral disorders: Secondary | ICD-10-CM | POA: Diagnosis not present

## 2022-12-26 ENCOUNTER — Other Ambulatory Visit: Payer: Self-pay | Admitting: Family Medicine

## 2022-12-26 DIAGNOSIS — Z1231 Encounter for screening mammogram for malignant neoplasm of breast: Secondary | ICD-10-CM

## 2022-12-26 DIAGNOSIS — Z780 Asymptomatic menopausal state: Secondary | ICD-10-CM

## 2022-12-27 DIAGNOSIS — G4700 Insomnia, unspecified: Secondary | ICD-10-CM | POA: Diagnosis not present

## 2022-12-27 DIAGNOSIS — G4733 Obstructive sleep apnea (adult) (pediatric): Secondary | ICD-10-CM | POA: Diagnosis not present

## 2022-12-27 DIAGNOSIS — I10 Essential (primary) hypertension: Secondary | ICD-10-CM | POA: Diagnosis not present

## 2022-12-27 DIAGNOSIS — R0683 Snoring: Secondary | ICD-10-CM | POA: Diagnosis not present

## 2022-12-27 DIAGNOSIS — R400 Somnolence: Secondary | ICD-10-CM | POA: Diagnosis not present

## 2022-12-27 DIAGNOSIS — F411 Generalized anxiety disorder: Secondary | ICD-10-CM | POA: Diagnosis not present

## 2022-12-27 DIAGNOSIS — F331 Major depressive disorder, recurrent, moderate: Secondary | ICD-10-CM | POA: Diagnosis not present

## 2023-01-09 DIAGNOSIS — G4730 Sleep apnea, unspecified: Secondary | ICD-10-CM | POA: Diagnosis not present

## 2023-01-30 DIAGNOSIS — D649 Anemia, unspecified: Secondary | ICD-10-CM | POA: Diagnosis not present

## 2023-01-30 DIAGNOSIS — E785 Hyperlipidemia, unspecified: Secondary | ICD-10-CM | POA: Diagnosis not present

## 2023-01-30 DIAGNOSIS — R5383 Other fatigue: Secondary | ICD-10-CM | POA: Diagnosis not present

## 2023-01-30 DIAGNOSIS — E559 Vitamin D deficiency, unspecified: Secondary | ICD-10-CM | POA: Diagnosis not present

## 2023-02-06 DIAGNOSIS — E559 Vitamin D deficiency, unspecified: Secondary | ICD-10-CM | POA: Diagnosis not present

## 2023-02-06 DIAGNOSIS — Z789 Other specified health status: Secondary | ICD-10-CM | POA: Diagnosis not present

## 2023-02-06 DIAGNOSIS — G4733 Obstructive sleep apnea (adult) (pediatric): Secondary | ICD-10-CM | POA: Diagnosis not present

## 2023-02-06 DIAGNOSIS — E782 Mixed hyperlipidemia: Secondary | ICD-10-CM | POA: Diagnosis not present

## 2023-02-06 DIAGNOSIS — E538 Deficiency of other specified B group vitamins: Secondary | ICD-10-CM | POA: Diagnosis not present

## 2023-02-06 DIAGNOSIS — G720 Drug-induced myopathy: Secondary | ICD-10-CM | POA: Diagnosis not present

## 2023-02-08 DIAGNOSIS — G4733 Obstructive sleep apnea (adult) (pediatric): Secondary | ICD-10-CM | POA: Diagnosis not present

## 2023-02-25 DIAGNOSIS — Z008 Encounter for other general examination: Secondary | ICD-10-CM | POA: Diagnosis not present

## 2023-02-25 DIAGNOSIS — I10 Essential (primary) hypertension: Secondary | ICD-10-CM | POA: Diagnosis not present

## 2023-02-25 DIAGNOSIS — F324 Major depressive disorder, single episode, in partial remission: Secondary | ICD-10-CM | POA: Diagnosis not present

## 2023-02-25 DIAGNOSIS — Z6837 Body mass index (BMI) 37.0-37.9, adult: Secondary | ICD-10-CM | POA: Diagnosis not present

## 2023-02-25 DIAGNOSIS — E785 Hyperlipidemia, unspecified: Secondary | ICD-10-CM | POA: Diagnosis not present

## 2023-02-25 DIAGNOSIS — G43909 Migraine, unspecified, not intractable, without status migrainosus: Secondary | ICD-10-CM | POA: Diagnosis not present

## 2023-03-04 ENCOUNTER — Ambulatory Visit
Admission: RE | Admit: 2023-03-04 | Discharge: 2023-03-04 | Disposition: A | Discharge status: Home / Self Care | Payer: No Typology Code available for payment source | Source: Ambulatory Visit | Attending: Family Medicine | Admitting: Family Medicine

## 2023-03-04 DIAGNOSIS — Z1231 Encounter for screening mammogram for malignant neoplasm of breast: Secondary | ICD-10-CM

## 2023-03-28 DIAGNOSIS — G4733 Obstructive sleep apnea (adult) (pediatric): Secondary | ICD-10-CM | POA: Diagnosis not present

## 2023-04-10 DIAGNOSIS — G4733 Obstructive sleep apnea (adult) (pediatric): Secondary | ICD-10-CM | POA: Diagnosis not present

## 2023-04-10 DIAGNOSIS — G4700 Insomnia, unspecified: Secondary | ICD-10-CM | POA: Diagnosis not present

## 2023-04-10 DIAGNOSIS — I10 Essential (primary) hypertension: Secondary | ICD-10-CM | POA: Diagnosis not present

## 2023-04-29 DIAGNOSIS — H26493 Other secondary cataract, bilateral: Secondary | ICD-10-CM | POA: Diagnosis not present

## 2023-05-01 DIAGNOSIS — H26491 Other secondary cataract, right eye: Secondary | ICD-10-CM | POA: Diagnosis not present

## 2023-05-08 DIAGNOSIS — H26492 Other secondary cataract, left eye: Secondary | ICD-10-CM | POA: Diagnosis not present

## 2023-05-15 DIAGNOSIS — J309 Allergic rhinitis, unspecified: Secondary | ICD-10-CM | POA: Diagnosis not present

## 2023-05-15 DIAGNOSIS — H8110 Benign paroxysmal vertigo, unspecified ear: Secondary | ICD-10-CM | POA: Diagnosis not present

## 2023-05-15 DIAGNOSIS — I10 Essential (primary) hypertension: Secondary | ICD-10-CM | POA: Diagnosis not present

## 2023-05-15 DIAGNOSIS — H6590 Unspecified nonsuppurative otitis media, unspecified ear: Secondary | ICD-10-CM | POA: Diagnosis not present

## 2023-08-01 DIAGNOSIS — E785 Hyperlipidemia, unspecified: Secondary | ICD-10-CM | POA: Diagnosis not present

## 2023-08-01 DIAGNOSIS — R5383 Other fatigue: Secondary | ICD-10-CM | POA: Diagnosis not present

## 2023-08-01 DIAGNOSIS — D649 Anemia, unspecified: Secondary | ICD-10-CM | POA: Diagnosis not present

## 2023-08-01 DIAGNOSIS — R739 Hyperglycemia, unspecified: Secondary | ICD-10-CM | POA: Diagnosis not present

## 2023-08-01 DIAGNOSIS — E559 Vitamin D deficiency, unspecified: Secondary | ICD-10-CM | POA: Diagnosis not present

## 2023-08-07 DIAGNOSIS — R7303 Prediabetes: Secondary | ICD-10-CM | POA: Diagnosis not present

## 2023-08-07 DIAGNOSIS — M25812 Other specified joint disorders, left shoulder: Secondary | ICD-10-CM | POA: Diagnosis not present

## 2023-08-07 DIAGNOSIS — H6123 Impacted cerumen, bilateral: Secondary | ICD-10-CM | POA: Diagnosis not present

## 2023-08-07 DIAGNOSIS — Z789 Other specified health status: Secondary | ICD-10-CM | POA: Diagnosis not present

## 2023-08-07 DIAGNOSIS — E782 Mixed hyperlipidemia: Secondary | ICD-10-CM | POA: Diagnosis not present

## 2023-08-07 DIAGNOSIS — E559 Vitamin D deficiency, unspecified: Secondary | ICD-10-CM | POA: Diagnosis not present

## 2023-08-13 ENCOUNTER — Other Ambulatory Visit: Payer: PPO

## 2023-08-22 DIAGNOSIS — M25569 Pain in unspecified knee: Secondary | ICD-10-CM | POA: Diagnosis not present

## 2023-08-22 DIAGNOSIS — M25512 Pain in left shoulder: Secondary | ICD-10-CM | POA: Diagnosis not present

## 2023-08-28 DIAGNOSIS — M25512 Pain in left shoulder: Secondary | ICD-10-CM | POA: Diagnosis not present

## 2023-08-28 DIAGNOSIS — M25569 Pain in unspecified knee: Secondary | ICD-10-CM | POA: Diagnosis not present

## 2023-08-30 DIAGNOSIS — M25512 Pain in left shoulder: Secondary | ICD-10-CM | POA: Diagnosis not present

## 2023-08-30 DIAGNOSIS — M25569 Pain in unspecified knee: Secondary | ICD-10-CM | POA: Diagnosis not present

## 2023-09-04 DIAGNOSIS — M25512 Pain in left shoulder: Secondary | ICD-10-CM | POA: Diagnosis not present

## 2023-09-04 DIAGNOSIS — M25569 Pain in unspecified knee: Secondary | ICD-10-CM | POA: Diagnosis not present

## 2023-09-06 DIAGNOSIS — M25512 Pain in left shoulder: Secondary | ICD-10-CM | POA: Diagnosis not present

## 2023-09-06 DIAGNOSIS — M25569 Pain in unspecified knee: Secondary | ICD-10-CM | POA: Diagnosis not present

## 2023-09-10 ENCOUNTER — Ambulatory Visit (HOSPITAL_BASED_OUTPATIENT_CLINIC_OR_DEPARTMENT_OTHER)
Admission: RE | Admit: 2023-09-10 | Discharge: 2023-09-10 | Disposition: A | Discharge status: Home / Self Care | Source: Ambulatory Visit | Attending: Family Medicine | Admitting: Family Medicine

## 2023-09-10 DIAGNOSIS — M8588 Other specified disorders of bone density and structure, other site: Secondary | ICD-10-CM | POA: Diagnosis not present

## 2023-09-10 DIAGNOSIS — Z780 Asymptomatic menopausal state: Secondary | ICD-10-CM | POA: Diagnosis not present

## 2023-09-11 DIAGNOSIS — M25569 Pain in unspecified knee: Secondary | ICD-10-CM | POA: Diagnosis not present

## 2023-09-11 DIAGNOSIS — M25512 Pain in left shoulder: Secondary | ICD-10-CM | POA: Diagnosis not present

## 2023-09-12 DIAGNOSIS — I10 Essential (primary) hypertension: Secondary | ICD-10-CM | POA: Diagnosis not present

## 2023-09-12 DIAGNOSIS — M8580 Other specified disorders of bone density and structure, unspecified site: Secondary | ICD-10-CM | POA: Diagnosis not present

## 2023-09-12 DIAGNOSIS — Z79899 Other long term (current) drug therapy: Secondary | ICD-10-CM | POA: Diagnosis not present

## 2023-09-12 DIAGNOSIS — E559 Vitamin D deficiency, unspecified: Secondary | ICD-10-CM | POA: Diagnosis not present

## 2023-09-12 DIAGNOSIS — Z789 Other specified health status: Secondary | ICD-10-CM | POA: Diagnosis not present

## 2023-09-12 DIAGNOSIS — G720 Drug-induced myopathy: Secondary | ICD-10-CM | POA: Diagnosis not present

## 2023-09-12 DIAGNOSIS — E782 Mixed hyperlipidemia: Secondary | ICD-10-CM | POA: Diagnosis not present

## 2023-09-13 DIAGNOSIS — M25569 Pain in unspecified knee: Secondary | ICD-10-CM | POA: Diagnosis not present

## 2023-09-13 DIAGNOSIS — M25512 Pain in left shoulder: Secondary | ICD-10-CM | POA: Diagnosis not present

## 2023-09-25 DIAGNOSIS — M25512 Pain in left shoulder: Secondary | ICD-10-CM | POA: Diagnosis not present

## 2023-09-25 DIAGNOSIS — M25569 Pain in unspecified knee: Secondary | ICD-10-CM | POA: Diagnosis not present

## 2023-09-27 DIAGNOSIS — M25569 Pain in unspecified knee: Secondary | ICD-10-CM | POA: Diagnosis not present

## 2023-09-27 DIAGNOSIS — M25512 Pain in left shoulder: Secondary | ICD-10-CM | POA: Diagnosis not present

## 2023-10-02 DIAGNOSIS — M25512 Pain in left shoulder: Secondary | ICD-10-CM | POA: Diagnosis not present

## 2023-10-02 DIAGNOSIS — M25569 Pain in unspecified knee: Secondary | ICD-10-CM | POA: Diagnosis not present

## 2023-10-04 DIAGNOSIS — M25569 Pain in unspecified knee: Secondary | ICD-10-CM | POA: Diagnosis not present

## 2023-10-04 DIAGNOSIS — M25512 Pain in left shoulder: Secondary | ICD-10-CM | POA: Diagnosis not present

## 2023-10-09 DIAGNOSIS — M25569 Pain in unspecified knee: Secondary | ICD-10-CM | POA: Diagnosis not present

## 2023-10-09 DIAGNOSIS — M25512 Pain in left shoulder: Secondary | ICD-10-CM | POA: Diagnosis not present

## 2023-10-11 DIAGNOSIS — M25569 Pain in unspecified knee: Secondary | ICD-10-CM | POA: Diagnosis not present

## 2023-10-11 DIAGNOSIS — M25512 Pain in left shoulder: Secondary | ICD-10-CM | POA: Diagnosis not present

## 2023-10-14 DIAGNOSIS — Z23 Encounter for immunization: Secondary | ICD-10-CM | POA: Diagnosis not present

## 2023-10-14 DIAGNOSIS — G4733 Obstructive sleep apnea (adult) (pediatric): Secondary | ICD-10-CM | POA: Diagnosis not present

## 2023-10-14 DIAGNOSIS — Z7185 Encounter for immunization safety counseling: Secondary | ICD-10-CM | POA: Diagnosis not present

## 2023-10-14 DIAGNOSIS — I10 Essential (primary) hypertension: Secondary | ICD-10-CM | POA: Diagnosis not present

## 2023-10-14 DIAGNOSIS — J309 Allergic rhinitis, unspecified: Secondary | ICD-10-CM | POA: Diagnosis not present

## 2023-10-14 DIAGNOSIS — M25812 Other specified joint disorders, left shoulder: Secondary | ICD-10-CM | POA: Diagnosis not present

## 2023-10-23 DIAGNOSIS — M25512 Pain in left shoulder: Secondary | ICD-10-CM | POA: Diagnosis not present

## 2023-10-23 DIAGNOSIS — M25569 Pain in unspecified knee: Secondary | ICD-10-CM | POA: Diagnosis not present

## 2023-10-25 DIAGNOSIS — M25512 Pain in left shoulder: Secondary | ICD-10-CM | POA: Diagnosis not present

## 2023-10-25 DIAGNOSIS — M25569 Pain in unspecified knee: Secondary | ICD-10-CM | POA: Diagnosis not present
